# Patient Record
Sex: Female | Born: 1970
Health system: Southern US, Community
[De-identification: ages and names within clinical notes are randomized; demographics above are authoritative.]

## PROBLEM LIST (undated history)

## (undated) DIAGNOSIS — D649 Anemia, unspecified: Secondary | ICD-10-CM

## (undated) HISTORY — PX: WISDOM TOOTH EXTRACTION: SHX21

## (undated) HISTORY — PX: CHOLECYSTECTOMY: SHX55

## (undated) HISTORY — DX: Anemia, unspecified: D64.9

---

## 1998-06-28 ENCOUNTER — Other Ambulatory Visit: Admission: RE | Admit: 1998-06-28 | Discharge: 1998-06-28 | Payer: Self-pay | Admitting: Obstetrics & Gynecology

## 1998-06-28 ENCOUNTER — Encounter (INDEPENDENT_AMBULATORY_CARE_PROVIDER_SITE_OTHER): Payer: Self-pay | Admitting: Specialist

## 1999-03-20 ENCOUNTER — Inpatient Hospital Stay (HOSPITAL_COMMUNITY): Admission: AD | Admit: 1999-03-20 | Discharge: 1999-03-20 | Payer: Self-pay | Admitting: Obstetrics and Gynecology

## 2004-01-03 HISTORY — PX: REFRACTIVE SURGERY: SHX103

## 2004-03-25 ENCOUNTER — Ambulatory Visit: Payer: Self-pay | Admitting: Family Medicine

## 2004-04-06 ENCOUNTER — Ambulatory Visit: Payer: Self-pay | Admitting: Family Medicine

## 2005-05-16 ENCOUNTER — Ambulatory Visit: Payer: Self-pay | Admitting: Family Medicine

## 2005-07-10 ENCOUNTER — Encounter: Admission: RE | Admit: 2005-07-10 | Discharge: 2005-10-08 | Payer: Self-pay | Admitting: Family Medicine

## 2005-07-20 ENCOUNTER — Ambulatory Visit: Payer: Self-pay | Admitting: Family Medicine

## 2005-08-21 ENCOUNTER — Ambulatory Visit: Payer: Self-pay | Admitting: Family Medicine

## 2005-09-21 ENCOUNTER — Ambulatory Visit: Payer: Self-pay | Admitting: Family Medicine

## 2005-10-26 ENCOUNTER — Ambulatory Visit: Payer: Self-pay | Admitting: Family Medicine

## 2006-02-01 ENCOUNTER — Ambulatory Visit: Payer: Self-pay | Admitting: Family Medicine

## 2006-04-12 ENCOUNTER — Ambulatory Visit: Payer: Self-pay | Admitting: Family Medicine

## 2006-05-16 DIAGNOSIS — N390 Urinary tract infection, site not specified: Secondary | ICD-10-CM | POA: Insufficient documentation

## 2006-05-17 ENCOUNTER — Ambulatory Visit: Payer: Self-pay | Admitting: Family Medicine

## 2006-06-04 ENCOUNTER — Telehealth: Payer: Self-pay | Admitting: Family Medicine

## 2006-06-05 ENCOUNTER — Telehealth (INDEPENDENT_AMBULATORY_CARE_PROVIDER_SITE_OTHER): Payer: Self-pay | Admitting: *Deleted

## 2006-06-11 ENCOUNTER — Telehealth (INDEPENDENT_AMBULATORY_CARE_PROVIDER_SITE_OTHER): Payer: Self-pay | Admitting: *Deleted

## 2006-06-18 ENCOUNTER — Encounter: Admission: RE | Admit: 2006-06-18 | Discharge: 2006-06-18 | Payer: Self-pay | Admitting: Family Medicine

## 2006-08-14 ENCOUNTER — Ambulatory Visit: Payer: Self-pay | Admitting: Family Medicine

## 2006-08-21 ENCOUNTER — Encounter (INDEPENDENT_AMBULATORY_CARE_PROVIDER_SITE_OTHER): Payer: Self-pay | Admitting: *Deleted

## 2006-08-21 ENCOUNTER — Telehealth: Payer: Self-pay | Admitting: Family Medicine

## 2006-08-21 LAB — CONVERTED CEMR LAB
Creatinine,U: 65.3 mg/dL
GFR calc Af Amer: 144 mL/min
GFR calc non Af Amer: 119 mL/min
HDL: 31.2 mg/dL — ABNORMAL LOW (ref >39.0)
LDL Cholesterol: 84 mg/dL (ref 0–99)
Potassium: 4.1 meq/L (ref 3.5–5.1)
Sodium: 139 meq/L (ref 135–145)
Total CHOL/HDL Ratio: 4.1
Triglycerides: 71 mg/dL (ref 0–149)
VLDL: 14 mg/dL (ref 0–40)

## 2006-09-07 ENCOUNTER — Encounter: Payer: Self-pay | Admitting: Family Medicine

## 2006-11-12 ENCOUNTER — Ambulatory Visit: Payer: Self-pay | Admitting: Family Medicine

## 2006-11-12 DIAGNOSIS — R05 Cough: Secondary | ICD-10-CM

## 2006-11-12 DIAGNOSIS — J309 Allergic rhinitis, unspecified: Secondary | ICD-10-CM | POA: Insufficient documentation

## 2006-11-12 DIAGNOSIS — R059 Cough, unspecified: Secondary | ICD-10-CM | POA: Insufficient documentation

## 2006-12-12 ENCOUNTER — Telehealth (INDEPENDENT_AMBULATORY_CARE_PROVIDER_SITE_OTHER): Payer: Self-pay | Admitting: *Deleted

## 2007-01-08 ENCOUNTER — Telehealth (INDEPENDENT_AMBULATORY_CARE_PROVIDER_SITE_OTHER): Payer: Self-pay | Admitting: *Deleted

## 2007-01-22 ENCOUNTER — Encounter: Payer: Self-pay | Admitting: Family Medicine

## 2007-03-07 ENCOUNTER — Ambulatory Visit: Payer: Self-pay | Admitting: Family Medicine

## 2007-03-07 DIAGNOSIS — D649 Anemia, unspecified: Secondary | ICD-10-CM | POA: Insufficient documentation

## 2007-03-08 ENCOUNTER — Encounter: Payer: Self-pay | Admitting: Family Medicine

## 2007-03-11 ENCOUNTER — Telehealth (INDEPENDENT_AMBULATORY_CARE_PROVIDER_SITE_OTHER): Payer: Self-pay | Admitting: *Deleted

## 2007-05-30 ENCOUNTER — Encounter: Payer: Self-pay | Admitting: Family Medicine

## 2007-07-19 ENCOUNTER — Encounter (INDEPENDENT_AMBULATORY_CARE_PROVIDER_SITE_OTHER): Payer: Self-pay | Admitting: *Deleted

## 2007-07-19 ENCOUNTER — Ambulatory Visit: Payer: Self-pay | Admitting: Family Medicine

## 2007-07-19 ENCOUNTER — Encounter: Admission: RE | Admit: 2007-07-19 | Discharge: 2007-07-19 | Payer: Self-pay | Admitting: Family Medicine

## 2007-07-22 ENCOUNTER — Telehealth (INDEPENDENT_AMBULATORY_CARE_PROVIDER_SITE_OTHER): Payer: Self-pay | Admitting: *Deleted

## 2007-07-22 ENCOUNTER — Encounter: Payer: Self-pay | Admitting: Family Medicine

## 2007-11-25 ENCOUNTER — Encounter: Payer: Self-pay | Admitting: Family Medicine

## 2007-12-30 ENCOUNTER — Telehealth (INDEPENDENT_AMBULATORY_CARE_PROVIDER_SITE_OTHER): Payer: Self-pay | Admitting: *Deleted

## 2008-02-03 ENCOUNTER — Encounter: Payer: Self-pay | Admitting: Family Medicine

## 2008-02-03 HISTORY — PX: GASTRIC BYPASS: SHX52

## 2008-02-05 ENCOUNTER — Encounter: Payer: Self-pay | Admitting: Family Medicine

## 2008-02-10 ENCOUNTER — Telehealth (INDEPENDENT_AMBULATORY_CARE_PROVIDER_SITE_OTHER): Payer: Self-pay | Admitting: *Deleted

## 2008-02-28 ENCOUNTER — Encounter: Payer: Self-pay | Admitting: Family Medicine

## 2008-03-25 ENCOUNTER — Encounter: Payer: Self-pay | Admitting: Family Medicine

## 2008-04-08 ENCOUNTER — Encounter: Payer: Self-pay | Admitting: Family Medicine

## 2008-06-24 ENCOUNTER — Ambulatory Visit: Payer: Self-pay | Admitting: Obstetrics and Gynecology

## 2008-07-13 ENCOUNTER — Encounter: Payer: Self-pay | Admitting: Family Medicine

## 2008-08-07 ENCOUNTER — Encounter: Payer: Self-pay | Admitting: Family Medicine

## 2008-10-21 ENCOUNTER — Telehealth: Payer: Self-pay | Admitting: Family Medicine

## 2008-11-25 ENCOUNTER — Ambulatory Visit: Payer: Self-pay | Admitting: Family Medicine

## 2008-11-25 ENCOUNTER — Encounter (INDEPENDENT_AMBULATORY_CARE_PROVIDER_SITE_OTHER): Payer: Self-pay | Admitting: *Deleted

## 2008-11-25 DIAGNOSIS — Z9884 Bariatric surgery status: Secondary | ICD-10-CM | POA: Insufficient documentation

## 2008-12-01 ENCOUNTER — Telehealth: Payer: Self-pay | Admitting: Family Medicine

## 2008-12-28 ENCOUNTER — Encounter: Payer: Self-pay | Admitting: Family Medicine

## 2009-09-03 ENCOUNTER — Ambulatory Visit: Payer: Self-pay | Admitting: Family Medicine

## 2009-09-07 LAB — CONVERTED CEMR LAB
ALT: 24 units/L (ref 0–35)
AST: 20 units/L (ref 0–37)
Basophils Relative: 0.6 % (ref 0.0–3.0)
Bilirubin, Direct: 0.1 mg/dL (ref 0.0–0.3)
Chloride: 102 meq/L (ref 96–112)
Eosinophils Absolute: 0.1 10*3/uL (ref 0.0–0.7)
Eosinophils Relative: 1.8 % (ref 0.0–5.0)
Folate: 6.6 ng/mL
GFR calc non Af Amer: 119.15 mL/min (ref >60)
HCT: 38.2 % (ref 36.0–46.0)
LDL Cholesterol: 75 mg/dL (ref 0–99)
Lymphs Abs: 1.7 10*3/uL (ref 0.7–4.0)
MCHC: 33.9 g/dL (ref 30.0–36.0)
MCV: 85.5 fL (ref 78.0–100.0)
Monocytes Absolute: 0.4 10*3/uL (ref 0.1–1.0)
Neutrophils Relative %: 53 % (ref 43.0–77.0)
Platelets: 194 10*3/uL (ref 150.0–400.0)
Potassium: 4.2 meq/L (ref 3.5–5.1)
Saturation Ratios: 14.5 % — ABNORMAL LOW (ref 20.0–50.0)
Sodium: 137 meq/L (ref 135–145)
Total Bilirubin: 0.4 mg/dL (ref 0.3–1.2)
Total CHOL/HDL Ratio: 4
VLDL: 9 mg/dL (ref 0.0–40.0)
Vit D, 25-Hydroxy: 32 ng/mL (ref 30–89)
Vitamin B-12: 345 pg/mL (ref 211–911)
WBC: 4.7 10*3/uL (ref 4.5–10.5)

## 2009-09-08 ENCOUNTER — Encounter: Payer: Self-pay | Admitting: Family Medicine

## 2009-09-10 ENCOUNTER — Telehealth (INDEPENDENT_AMBULATORY_CARE_PROVIDER_SITE_OTHER): Payer: Self-pay | Admitting: *Deleted

## 2009-09-30 ENCOUNTER — Encounter: Payer: Self-pay | Admitting: Family Medicine

## 2009-12-19 ENCOUNTER — Emergency Department: Payer: Self-pay | Admitting: Unknown Physician Specialty

## 2010-01-30 LAB — CONVERTED CEMR LAB
ALT: 26 units/L (ref 0–35)
AST: 19 units/L (ref 0–37)
Albumin: 3.4 g/dL — ABNORMAL LOW (ref 3.5–5.2)
BUN: 6 mg/dL (ref 6–23)
Basophils Relative: 0.8 % (ref 0.0–3.0)
Chloride: 106 meq/L (ref 96–112)
Cholesterol: 138 mg/dL (ref 0–200)
Eosinophils Relative: 0.9 % (ref 0.0–5.0)
Ferritin: 11.1 ng/mL (ref 10.0–291.0)
Folate: 13.1 ng/mL
Glucose, Bld: 84 mg/dL (ref 70–99)
HCT: 42.2 % (ref 36.0–46.0)
Hemoglobin: 14.1 g/dL (ref 12.0–15.0)
Iron: 86 ug/dL (ref 42–145)
Ketones, urine, test strip: NEGATIVE
LDL Cholesterol: 96 mg/dL (ref 0–99)
Lymphs Abs: 1.7 10*3/uL (ref 0.7–4.0)
MCV: 90.8 fL (ref 78.0–100.0)
Monocytes Absolute: 0.3 10*3/uL (ref 0.1–1.0)
Neutro Abs: 3.5 10*3/uL (ref 1.4–7.7)
Nitrite: POSITIVE
Platelets: 180 10*3/uL (ref 150.0–400.0)
Potassium: 3.9 meq/L (ref 3.5–5.1)
Saturation Ratios: 24.6 % (ref 20.0–50.0)
Sodium: 140 meq/L (ref 135–145)
TSH: 1.65 microintl units/mL (ref 0.35–5.50)
Total Bilirubin: 0.7 mg/dL (ref 0.3–1.2)
Total Protein: 6 g/dL (ref 6.0–8.3)
Urobilinogen, UA: NEGATIVE
Vitamin B-12: 210 pg/mL — ABNORMAL LOW (ref 211–911)
WBC: 5.5 10*3/uL (ref 4.5–10.5)

## 2010-02-01 NOTE — Assessment & Plan Note (Signed)
Summary: needs lab for work/cbs   Vital Signs:  Patient profile:   40 year old female Height:      67 inches Weight:      212 pounds BMI:     33.32 Temp:     98.7 degrees F oral Pulse rate:   72 / minute BP sitting:   110 / 62  (left arm)  Vitals Entered By: Jeremy Johann CMA (September 03, 2009 12:44 PM) CC: fasting, labs, complete form   History of Present Illness: Pt here to have labs and form filled out for work.  No complaints.   Current Medications (verified): 1)  None  Allergies (verified): 1)  ! Pcn 2)  ! * Tetanus 3)  ! Amoxicillin  Past History:  Past medical, surgical, family and social histories (including risk factors) reviewed for relevance to current acute and chronic problems.  Past Medical History: Reviewed history from 05/16/2006 and no changes required. Diabetes mellitus, type II  Past Surgical History: Reviewed history from 11/25/2008 and no changes required. gastric bypass  02/03/2008 wisdom teeth lasik surgery-- 2006  Family History: Reviewed history from 11/25/2008 and no changes required. M--A fib, chf, dvt Family History Diabetes 1st degree relative Family History High cholesterol Family History Hypertension MGF--stroke MGM--MI at 42 yo F--mesothilioma  Social History: Reviewed history from 11/25/2008 and no changes required. 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 Lungs are clear to auscultation, no crackles or wheezes. Heart:  Normal rate and regular rhythm. S1 and S2 normal without gallop, murmur, click, rub or other extra sounds. Abdomen:  Bowel sounds positive,abdomen soft and non-tender without masses, organomegaly or  hernias noted. Psych:  Cognition and judgment appear intact. Alert and cooperative with normal attention span and concentration. No apparent delusions, illusions, hallucinations   Impression & Recommendations:  Problem # 1:  BARIATRIC SURGERY STATUS (ICD-V45.86)  Orders: TLB-IBC Pnl (Iron/FE;Transferrin) (83550-IBC) T-Vitamin D (25-Hydroxy) (91478-29562) Vit B12 1000 mcg (J3420) Admin of Therapeutic Inj  intramuscular or subcutaneous (13086)  Problem # 2:  UNSPECIFIED ANEMIA (ICD-285.9)  The following medications were removed from the medication list:    Nascobal 500 Mcg/0.85ml Soln (Cyanocobalamin) .Marland Kitchen... 1 spray 1 nostril once a week Her updated medication list for this problem includes:    Cyanocobalamin 1000 Mcg/ml Soln (Cyanocobalamin) .Marland Kitchen... 1 ml subcutaneously weekly x4 then 1ml subcutaneously monthly  Orders: Venipuncture (57846) TLB-Lipid Panel (80061-LIPID) TLB-BMP (Basic Metabolic Panel-BMET) (80048-METABOL) TLB-Hepatic/Liver Function Pnl (80076-HEPATIC) TLB-CBC Platelet - w/Differential (85025-CBCD) TLB-B12 + Folate Pnl (96295_28413-K44/WNU) TLB-IBC Pnl (Iron/FE;Transferrin) (83550-IBC) T-Vitamin D (25-Hydroxy) [id number])  Hgb: 14.1 (11/25/2008)   Hct: 42.2 (11/25/2008)   Platelets: 180.0 (11/25/2008) RBC: 4.64 (11/25/2008)   RDW: 13.4 (11/25/2008)   WBC: 5.5 (11/25/2008) MCV: 90.8 (11/25/2008)   MCHC: 33.3 (11/25/2008) Ferritin: 11.1 (11/25/2008) Iron: 86 (11/25/2008)   % Sat: 24.6 (11/25/2008) B12: 210 (11/25/2008)   Folate: 13.1 (11/25/2008)   TSH: 1.65 (11/25/2008)  Complete Medication List: 1)  Cyanocobalamin 1000 Mcg/ml Soln (Cyanocobalamin) .Marland Kitchen.. 1 ml subcutaneously weekly x4 then 1ml subcutaneously monthly Prescriptions: CYANOCOBALAMIN 1000 MCG/ML SOLN (CYANOCOBALAMIN) 1 ml Subcutaneously weekly x4 then 1ml Subcutaneously monthly  #3 months x 3   Entered and Authorized by:   Loreen Freud DO   Signed by:   Loreen Freud  DO on 09/03/2009   Method used:    Faxed to ...       [facility])             , [place]         Ph: [phone]       Fax: [phone]   RxID:   [id number] CYANOCOBALAMIN 1000 MCG/ML SOLN (CYANOCOBALAMIN) 1 ml Subcutaneously weekly x4 then 1ml Subcutaneously monthly  #3 months x 3   Entered and Authorized by:   Loreen Freud DO   Signed by:   Loreen Freud DO on 09/03/2009   Method used:   Electronically to        [facility] [phone]* (retail)       [address]       [place], [place]  [address]       Ph: [phone]       Fax: [phone]   RxID:   [id number]    Medication Administration  Injection # 1:    Medication: Vit B12 1000 mcg    Diagnosis: BARIATRIC SURGERY STATUS (ICD-V45.86)    Route: IM    Site: L deltoid    Exp Date: 03/03/2011    Lot #: [id number]    Mfr: American Regent    Patient tolerated injection without complications    Given by: Almeta Monas CMA Duncan Dull) (September 03, 2009 1:14 PM)  Orders Added: 1)  Venipuncture [60109] 2)  TLB-Lipid Panel [80061-LIPID] 3)  TLB-BMP (Basic Metabolic Panel-BMET) [80048-METABOL] 4)  TLB-Hepatic/Liver Function Pnl [80076-HEPATIC] 5)  TLB-CBC Platelet - w/Differential [85025-CBCD] 6)  TLB-B12 + Folate Pnl [82746_82607-B12/FOL] 7)  TLB-IBC Pnl (Iron/FE;Transferrin) [83550-IBC] 8)  T-Vitamin D (25-Hydroxy) [32355-73220] 9)  Est. Patient Level III [25427] 10)  Vit B12 1000 mcg [J3420] 11)  Admin of Therapeutic Inj  intramuscular or subcutaneous [06237]

## 2010-02-01 NOTE — Progress Notes (Signed)
Summary: Rx Clarification  Phone Note Call from Patient   Caller: Patient Call For: Loreen Freud DO Details for Reason: [employer] need Med verification  on Rx Summary of Call: Spk with pt and she advised that Erin Howe will not fill Rx until they have clarification. Called [employer] and verified meds as B-12 generic 1ml weekly x4 wks then once a month for 3 months.  Bosie Clos (pharmacist) voiced understanding, call ended.  Almeta Monas CMA Duncan Dull)  September 10, 2009 8:50 AM  Initial call taken by: Almeta Monas CMA Duncan Dull),  September 10, 2009 8:50 AM     Appended Document: Rx Clarification spk with Eber Jones at [facility], whocalled to advise that the Vit B12 was not avai in injection, said they have 2500 micrograms tablet available at a quantity of 100. Dr. Laury Axon gave verbal authorization. Vitamin B-12 2500 micrograms 1 sublingual daily # 100 with 3 refills. OK'd by Violet the Pharmacist. Pt aware.

## 2010-02-01 NOTE — Letter (Signed)
Summary: Metabolic Syndrome Form/[employer]  Metabolic Syndrome Form/[employer]   Imported By: Lanelle Bal 09/20/2009 10:35:19  _____________________________________________________________________  External Attachment:    Type:   Image     Comment:   External Document

## 2010-02-01 NOTE — Medication Information (Signed)
Summary: Care Consideration Regarding Lipid Modifying Therapy/[employer]  Care Consideration Regarding Lipid Modifying Therapy/[employer]   Imported By: Lanelle Bal 10/26/2009 14:00:33  _____________________________________________________________________  External Attachment:    Type:   Image     Comment:   External Document

## 2010-02-01 NOTE — Letter (Signed)
Summary: Letter Regarding Disease Mgmt. Program/[employer]  Letter Regarding Disease Mgmt. Program/[employer]   Imported By: Lanelle Bal 01/19/2009 14:24:27  _____________________________________________________________________  External Attachment:    Type:   Image     Comment:   External Document

## 2010-05-20 NOTE — Letter (Signed)
September 06, 2005     [facility]  [address], [address]  [place], [place] [address]   RE:  Erin Howe, Erin Howe  MRN:  [id number]  /  DOB:  1970/01/14   To Whom It May Concern:   Ms. Raimondi is a 40 year old white female who is interested in having gastric  bypass surgery.  She has only been seeing me since May of this year who has  a long medical history of severe obesity, chronic bladder infection, PCOS  and diabetes.  Patient has already been to [employer], has done extensive research on her own and understands the risks of  the surgery as well as the diet expectations immediately before and after  surgery.  The patient, in the past, has tried various attempts at dieting.  She has one calorie counting three times and lost 10 pounds but gained 15  back, that was back in 1991.  In 1994, she did a low carbohydrate diet and  lost 10 pounds but gained 30.  In 1993, she tried a low fat diet two times  and lost 15 pounds but gained 25 back.  In 1986, she tried a [employer] and lost 30 pounds but gained 50 back.  She has done [employer] and  lost 2 pounds in 1990.  She has tried [employer] and [employer]  as well as [employer] and [employer]ast and has been on Meridia which was  the last attempt in 2004, she lost 5 pounds and gained 20.  Patient has also  been to a dietician two times, lost 10 pounds and gained 20 back.  She is  currently at her highest weight which is 373 pounds, that was on August 21, 2005, her height is 5 feet 6 inches, her BMI is 60.1.  In 2003, the patient  weighed 346 pounds, in 2004 she was at 347.5, in 2005 all we could get from  old records is actually greater than 350 pounds and in 2007 the patient  states she was at 371 pounds though scales in her doctor's office only went  to 350.  At her first visit here in May, the patient was at 371 pounds.  As  previously stated, the patient has  done research on the gastric bypass  surgery and is aware that she will need to lose some weight before the  surgery and has already started coming into my office on a monthly basis for  weight checks and diet counseling.  They are considering  also the [employer] program but I do consider this patient to be a good  candidate for the surgery after the appropriate amount of weight is lost.    Sincerely,      Lelon Perla, DO   YRL/MedQ  DD:  09/06/2005  DT:  09/06/2005  Job #:  [id number]

## 2010-07-21 ENCOUNTER — Encounter: Payer: Self-pay | Admitting: Family Medicine

## 2010-07-22 ENCOUNTER — Encounter: Payer: Self-pay | Admitting: Family Medicine

## 2010-07-22 ENCOUNTER — Ambulatory Visit (INDEPENDENT_AMBULATORY_CARE_PROVIDER_SITE_OTHER): Payer: Managed Care, Other (non HMO) | Admitting: Family Medicine

## 2010-07-22 VITALS — BP 120/82 | HR 56 | Temp 98.7°F | Resp 16 | Ht 65.0 in | Wt 237.0 lb

## 2010-07-22 DIAGNOSIS — Z9884 Bariatric surgery status: Secondary | ICD-10-CM

## 2010-07-22 DIAGNOSIS — Z0000 Encounter for general adult medical examination without abnormal findings: Secondary | ICD-10-CM

## 2010-07-22 DIAGNOSIS — E538 Deficiency of other specified B group vitamins: Secondary | ICD-10-CM

## 2010-07-22 DIAGNOSIS — F988 Other specified behavioral and emotional disorders with onset usually occurring in childhood and adolescence: Secondary | ICD-10-CM

## 2010-07-22 LAB — VITAMIN B12: Vitamin B-12: 282 pg/mL (ref 211–911)

## 2010-07-22 LAB — BASIC METABOLIC PANEL
Chloride: 101 mEq/L (ref 96–112)
GFR: 154.24 mL/min (ref >60.00)
Potassium: 3.6 mEq/L (ref 3.5–5.1)
Sodium: 138 mEq/L (ref 135–145)

## 2010-07-22 LAB — POCT URINALYSIS DIPSTICK
Bilirubin, UA: NEGATIVE
Glucose, UA: NEGATIVE
Spec Grav, UA: 1.03
Urobilinogen, UA: 0.2

## 2010-07-22 LAB — TSH: TSH: 1.68 u[IU]/mL (ref 0.35–5.50)

## 2010-07-22 LAB — HEPATIC FUNCTION PANEL
ALT: 19 U/L (ref 0–35)
AST: 20 U/L (ref 0–37)
Albumin: 3.7 g/dL (ref 3.5–5.2)
Total Bilirubin: 0.4 mg/dL (ref 0.3–1.2)
Total Protein: 6.7 g/dL (ref 6.0–8.3)

## 2010-07-22 LAB — CBC WITH DIFFERENTIAL/PLATELET
Basophils Relative: 0.5 % (ref 0.0–3.0)
Eosinophils Relative: 2.5 % (ref 0.0–5.0)
HCT: 31.5 % — ABNORMAL LOW (ref 36.0–46.0)
Hemoglobin: 10.1 g/dL — ABNORMAL LOW (ref 12.0–15.0)
Lymphs Abs: 1.9 10*3/uL (ref 0.7–4.0)
MCV: 70.2 fl — ABNORMAL LOW (ref 78.0–100.0)
Monocytes Absolute: 0.5 10*3/uL (ref 0.1–1.0)
Monocytes Relative: 8.6 % (ref 3.0–12.0)
Neutro Abs: 3.6 10*3/uL (ref 1.4–7.7)
RBC: 4.49 Mil/uL (ref 3.87–5.11)
WBC: 6.2 10*3/uL (ref 4.5–10.5)

## 2010-07-22 LAB — LIPID PANEL
Cholesterol: 119 mg/dL (ref 0–200)
LDL Cholesterol: 71 mg/dL (ref 0–99)

## 2010-07-22 LAB — HEMOGLOBIN A1C: Hgb A1c MFr Bld: 6.1 % (ref 4.6–6.5)

## 2010-07-22 MED ORDER — AMPHETAMINE-DEXTROAMPHET ER 20 MG PO CP24: 20.0000 mg | ORAL_CAPSULE | ORAL | Status: DC

## 2010-07-22 MED ORDER — OMEPRAZOLE 40 MG PO CPDR: 40.0000 mg | DELAYED_RELEASE_CAPSULE | Freq: Every day | ORAL | Status: DC

## 2010-07-22 MED ORDER — CYANOCOBALAMIN 1000 MCG/ML IJ SOLN: 1000.0000 ug | Freq: Once | INTRAMUSCULAR | Status: DC

## 2010-07-22 NOTE — Patient Instructions (Signed)

## 2010-07-22 NOTE — Progress Notes (Signed)
  Subjective:     Erin Howe is a 40 y.o. female and is here for a comprehensive physical exam. The patient reports problems - trouble with focusing,  dx with add years ago--never on meds..  History   Social History  . Marital Status: Married    Spouse Name: N/A    Number of Children: N/A  . Years of Education: N/A   Occupational History  . 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 Smoking status: Never Smoker   . Smokeless tobacco: Not on file  . Alcohol Use: No  . Drug Use: No  . Sexually Active: Yes -- Female partner(s)   Other Topics Concern  . Not on file   Social History Narrative  . No narrative on file   Health Maintenance  Topic Date Due  . Pap Smear  07/21/2012  . Tetanus/tdap  07/21/2020    The following portions of the patient's history were reviewed and updated as appropriate: allergies, current medications, past family history, past medical history, past social history, past surgical history and problem list.  Review of Systems Review of Systems  Constitutional: Negative for activity change, appetite change and fatigue.  HENT: Negative for hearing loss, congestion, tinnitus and ear discharge.  dentist q55m Eyes: Negative for visual disturbance (see optho q1y -- vision corrected to 20/20 with glasses).  Respiratory: Negative for cough, chest tightness and shortness of breath.   Cardiovascular: Negative for chest pain, palpitations and leg swelling.  Gastrointestinal: Negative for abdominal pain, diarrhea, constipation and abdominal distention.  Genitourinary: Negative for urgency, frequency, decreased urine volume and difficulty urinating.  Musculoskeletal: Negative for back pain, arthralgias and gait problem.  Skin: Negative for color change, pallor and rash.  Neurological: Negative for dizziness, [place]zee-headedness, numbness and headaches.  Hematological: Negative for adenopathy. Does not bruise/bleed easily.  Psychiatric/Behavioral:  Negative for suicidal ideas, confusion, sleep disturbance, self-injury, dysphoric mood, decreased concentration and agitation.       Objective:    BP 120/82  Pulse 56  Temp(Src) 98.7 F (37.1 C) (Oral)  Resp 16  Ht 5\' 5"  (1.651 m)  Wt 237 lb (107.502 kg)  BMI 39.44 kg/m2  SpO2 98% General appearance: alert, cooperative, appears stated age, no distress and morbidly obese Head: Normocephalic, without obvious abnormality, atraumatic Eyes: conjunctivae/corneas clear. PERRL, EOM's intact. Fundi benign. Ears: normal TM's and external ear canals both ears Nose: Nares normal. Septum midline. Mucosa normal. No drainage or sinus tenderness. Throat: lips, mucosa, and tongue normal; teeth and gums normal Neck: no adenopathy, no carotid bruit, no JVD, supple, symmetrical, trachea midline and thyroid not enlarged, symmetric, no tenderness/mass/nodules Back: symmetric, no curvature. ROM normal. No CVA tenderness. Lungs: clear to auscultation bilaterally Heart: regular rate and rhythm, S1, S2 normal, no murmur, click, rub or gallop Abdomen: soft, non-tender; bowel sounds normal; no masses,  no organomegaly Extremities: extremities normal, atraumatic, no cyanosis or edema Pulses: 2+ and symmetric Skin: Skin color, texture, turgor normal. No rashes or lesions Lymph nodes: Cervical, supraclavicular, and axillary nodes normal. Neurologic: Alert and oriented X 3, normal strength and tone. Normal symmetric reflexes. Normal coordination and gait psych-- no depression, no anxiety    Assessment:    Healthy female exam.   Obesity   Plan:    [facility] utd Discussed diet and exercise See After Visit Summary for Counseling Recommendations

## 2010-07-24 LAB — VITAMIN D 1,25 DIHYDROXY: Vitamin D3 1, 25 (OH)2: 71 pg/mL

## 2010-07-26 ENCOUNTER — Encounter: Payer: Self-pay | Admitting: *Deleted

## 2010-07-27 ENCOUNTER — Telehealth: Payer: Self-pay | Admitting: *Deleted

## 2010-07-27 MED ORDER — FERROUS SULFATE 325 (65 FE) MG PO TBEC: 325.0000 mg | DELAYED_RELEASE_TABLET | Freq: Every day | ORAL | Status: DC

## 2010-07-27 NOTE — Telephone Encounter (Signed)
Pt states that she was unable to find 325 mg of iron OTC. Pt indicated that per pharmacy the only strength available is     65 mg unless she has a Rx. Pt also notes that pharmacist advise her that 325 mg is a lot of iron to be taking.Please advise

## 2010-07-27 NOTE — Telephone Encounter (Signed)
325 mg ferrous sulfate is not too much to be taken daily----but if her pharmacy does not carry it she can take whatever is otc at her pharmacy and we can recheck in 1 month

## 2010-07-27 NOTE — Telephone Encounter (Signed)
Pt aware, would like Rx so that insurance can cover med. Pt aware Rx printed, Pt request that Rx be given to husband on tomorrow @ his OV.

## 2010-07-27 NOTE — Telephone Encounter (Signed)
Left message to call office

## 2010-08-22 ENCOUNTER — Ambulatory Visit (INDEPENDENT_AMBULATORY_CARE_PROVIDER_SITE_OTHER): Payer: Managed Care, Other (non HMO) | Admitting: Family Medicine

## 2010-08-22 ENCOUNTER — Encounter: Payer: Self-pay | Admitting: Family Medicine

## 2010-08-22 VITALS — BP 140/78 | HR 80 | Temp 99.4°F | Wt 231.0 lb

## 2010-08-22 DIAGNOSIS — Z9884 Bariatric surgery status: Secondary | ICD-10-CM

## 2010-08-22 DIAGNOSIS — R52 Pain, unspecified: Secondary | ICD-10-CM

## 2010-08-22 DIAGNOSIS — F988 Other specified behavioral and emotional disorders with onset usually occurring in childhood and adolescence: Secondary | ICD-10-CM

## 2010-08-22 DIAGNOSIS — D649 Anemia, unspecified: Secondary | ICD-10-CM

## 2010-08-22 MED ORDER — ACETAMINOPHEN 325 MG PO TABS: 650.0000 mg | ORAL_TABLET | ORAL | Status: DC | PRN

## 2010-08-22 MED ORDER — ACETAMINOPHEN 325 MG PO TABS: 650.0000 mg | ORAL_TABLET | ORAL | Status: AC | PRN

## 2010-08-22 MED ORDER — AMPHETAMINE-DEXTROAMPHET ER 30 MG PO CP24: 30.0000 mg | ORAL_CAPSULE | ORAL | Status: DC

## 2010-08-22 NOTE — Progress Notes (Signed)
  Subjective:    Patient ID: Erin Howe, female    DOB: 19-Jul-1970, 40 y.o.   MRN: [id number]  HPI Pt here for add f/u and to recheck b12 and cbc.  Pt states adderall is not lasting long enough.   Review of Systems As above    Objective:   Physical Exam  Constitutional: She is oriented to person, place, and time. She appears well-developed and well-nourished.  Cardiovascular: Normal rate, regular rhythm and normal heart sounds.   Pulmonary/Chest: Effort normal and breath sounds normal.  Abdominal: Soft. Bowel sounds are normal.  Neurological: She is alert and oriented to person, place, and time.  Psychiatric: She has a normal mood and affect. Her behavior is normal. Judgment and thought content normal.          Assessment & Plan:

## 2010-08-22 NOTE — Patient Instructions (Signed)
Attention Deficit-Hyperactivity Disorder ADHD Attention deficit-hyperactivity disorder (ADHD) is a problem with behavior issues based on the way the brain functions (neurobehavioral disorder). It is a common reason for behavior and academic problems in school. CAUSES The cause of ADHD is unknown in most cases. It may run in families. It sometimes can be associated with learning disabilities and other behavioral problems. SYMPTOMS There are three types of ADHD. Some of the symptoms include:  Inattentive   Gets bored or distracted easily   Loses or forgets things. Forgets to hand in homework.   Has trouble organizing or completing tasks.   Difficulty staying on task.   An inability to organize daily tasks and school work.   Leaving projects, chores and homework unfinished.   Trouble paying attention or responding to details. Careless mistakes.   Difficulty following directions. Often seems like is not listening.   Dislikes activities that require sustained attention (like chores or homework).   Hyperactive-impulsive   Feels like it is impossible to sit still or stay in a seat. Fidgeting with hands and feet.   Trouble waiting turn.   Talking too much or out of turn. Interruptive.   Speaks or acts impulsively   Aggressive, disruptive behavior   Constantly busy or on the go, noisy.   Combined   Has symptoms of both of the above.  Often children with ADHD feel discouraged about themselves and with school. They often perform well below their abilities in school. These symptoms can cause problems in home, school, and in relationships with peers. As children get older, the excess motor activities can calm down, but the problems with paying attention and staying organized persist. Most children do not outgrow ADHD but with good treatment can learn to cope with the symptoms. DIAGNOSIS When ADHD is suspected, the diagnosis should be made by professionals trained in ADHD.    Diagnosis will include:  Ruling out other reasons for the child's behavior.   The caregivers will check with the child's school and check their medical records.   They will talk to teachers and parents.   Behavior rating scales for the child will be filled out by those dealing with the child on a daily basis.  A diagnosis is made only after all information has been considered. TREATMENT Treatment usually includes behavioral treatment often along with medicines. It may include stimulant medicines. The stimulant medicines decrease impulsivity and hyperactivity and increase attention. Other medicines used include antidepressants and certain blood pressure medicines. Most experts agree that treatment for ADHD should address all aspects of the child's functioning. Treatment should not be limited to the use of medicines alone. Treatment should include structured classroom management. The parents must receive education to address rewarding good behavior, discipline and limit-setting. Tutoring and/or behavioral therapy should be available for the child. If untreated, the disorder can have long term serious effects into adolescence and adulthood. HOMECARE INSTRUCTIONS   Often with ADHD there is a lot of frustration among the family in dealing with the illness. There is often blame and anger that is not warranted. This is a life long illness. There is no way to prevent ADHD. In many cases, because the problem affects the family as a whole, the entire family may need help. A therapist can help the family find better ways to handle the disruptive behaviors and promote change. If the child is young, most of the therapist's work is with the parents. Parents will learn techniques for coping with and improving their child's behavior.   Sometimes only the child with the ADHD needs counseling. Your caregivers can help you make these decisions.   Children with ADHD may need help in organizing. Here are some helpful  tips:   Keep routines the same every day from wake-up time to bedtime. Schedule everything. This includes homework and playtime. This should include outdoor and indoor recreation. Keep the schedule on the refrigerator or a bulletin board where it is frequently seen. Mark schedule changes as far in advance as possible.   Have a place for everything and keep everything in its place. This includes clothing, backpacks, and school supplies.   Encourage writing down assignments and bringing home needed books.   Offer your child a well-balanced diet. Breakfast is especially important for school performance. Children should avoid drinks with caffeine including:   Soft drinks.   Coffee.   Tea.   However, some older children (adolescents) may find these drinks helpful in improving their attention.   Children with ADHD need consistent rules that they can understand and follow. If rules are followed, give small rewards. Children with ADHD often receive, and expect, criticism. Look for good behavior and praise it. Set realistic goals. Give clear instructions. Look for activities that can foster success and self-esteem. Make time for pleasant activities with your child. Give lots of affection.   Parents are their children's greatest advocates. Learn as much as possible about ADHD. This helps you become a stronger and better advocate for your child. It also helps you educate your child's teachers and instructors if they feel inadequate in these areas. Parent support groups are often helpful. A national group with local chapters is called CHADD (Children and Adults with Attention Deficit/Hyperactivity Disorder).  PROGNOSIS  There is no cure for ADHD. Children with the disorder seldom outgrow it. Many find adaptive ways to accommodate the ADHD as they mature. SEEK MEDICAL CARE IF YOUR CHILD HAS:  Repeated muscle twitches, cough or speech outbursts.   Sleep problems.   Marked loss of appetite.    Depression.   New or worsening behavioral problems.   Dizziness.   Racing heart.   Stomach pains.   Headaches.  Document Released: 12/09/2001 Document Re-Released: 09/28/2007 [employer] Patient Information 2011 [employer]. 

## 2010-08-23 DIAGNOSIS — F988 Other specified behavioral and emotional disorders with onset usually occurring in childhood and adolescence: Secondary | ICD-10-CM | POA: Insufficient documentation

## 2010-08-23 LAB — CBC WITH DIFFERENTIAL/PLATELET
Basophils Absolute: 0 10*3/uL (ref 0.0–0.1)
Eosinophils Absolute: 0.1 10*3/uL (ref 0.0–0.7)
HCT: 34 % — ABNORMAL LOW (ref 36.0–46.0)
Hemoglobin: 10.8 g/dL — ABNORMAL LOW (ref 12.0–15.0)
Lymphocytes Relative: 44.6 % (ref 12.0–46.0)
Lymphs Abs: 2 10*3/uL (ref 0.7–4.0)
MCHC: 31.7 g/dL (ref 30.0–36.0)
Neutro Abs: 1.7 10*3/uL (ref 1.4–7.7)
RDW: 19.3 % — ABNORMAL HIGH (ref 11.5–14.6)

## 2010-08-23 NOTE — Assessment & Plan Note (Signed)
Increase adderall xr to 30 mg daily rto 6 month or sooner prn

## 2010-08-23 NOTE — Assessment & Plan Note (Signed)
Check b12 and cbc

## 2010-09-29 ENCOUNTER — Other Ambulatory Visit: Payer: Self-pay | Admitting: Family Medicine

## 2010-09-29 DIAGNOSIS — F988 Other specified behavioral and emotional disorders with onset usually occurring in childhood and adolescence: Secondary | ICD-10-CM

## 2010-09-30 MED ORDER — AMPHETAMINE-DEXTROAMPHET ER 20 MG PO CP24: 20.0000 mg | ORAL_CAPSULE | ORAL | Status: DC

## 2010-09-30 NOTE — Telephone Encounter (Signed)
Please advise      KP 

## 2010-09-30 NOTE — Telephone Encounter (Signed)
As long as she is not due for ov its ok

## 2010-09-30 NOTE — Telephone Encounter (Signed)
Patient aware Rx ready for pick up.      KP 

## 2010-10-07 ENCOUNTER — Other Ambulatory Visit: Payer: Self-pay

## 2010-10-07 DIAGNOSIS — F988 Other specified behavioral and emotional disorders with onset usually occurring in childhood and adolescence: Secondary | ICD-10-CM

## 2010-10-07 MED ORDER — AMPHETAMINE-DEXTROAMPHET ER 30 MG PO CP24: 30.0000 mg | ORAL_CAPSULE | ORAL | Status: DC

## 2010-10-07 NOTE — Telephone Encounter (Signed)
Reprinted Rx patient had been increased to 30 mg   KP

## 2011-01-16 ENCOUNTER — Telehealth: Payer: Self-pay | Admitting: Family Medicine

## 2011-01-16 DIAGNOSIS — F988 Other specified behavioral and emotional disorders with onset usually occurring in childhood and adolescence: Secondary | ICD-10-CM

## 2011-01-16 MED ORDER — AMPHETAMINE-DEXTROAMPHET ER 30 MG PO CP24: 30.0000 mg | ORAL_CAPSULE | ORAL | Status: DC

## 2011-01-16 NOTE — Telephone Encounter (Signed)
VM left advising Rx ready for pick up.     KP 

## 2011-01-16 NOTE — Telephone Encounter (Signed)
Patient would like a 90 day refill for adderall

## 2011-02-07 ENCOUNTER — Telehealth: Payer: Self-pay

## 2011-02-07 DIAGNOSIS — F988 Other specified behavioral and emotional disorders with onset usually occurring in childhood and adolescence: Secondary | ICD-10-CM

## 2011-02-07 MED ORDER — AMPHETAMINE-DEXTROAMPHET ER 30 MG PO CP24: 30.0000 mg | ORAL_CAPSULE | ORAL | Status: DC

## 2011-02-07 NOTE — Telephone Encounter (Signed)
Ok to give 1 month supply

## 2011-02-07 NOTE — Telephone Encounter (Signed)
Discussed with patient and she voiced understanding.... Rx for 30 days printed and left at check in    [facility]

## 2011-02-07 NOTE — Telephone Encounter (Signed)
msg from patient and she stated that she mailed in her Adderall to the mail order company and the did not receive it, she wants to get a refill. Please advise     KP

## 2011-02-13 ENCOUNTER — Telehealth: Payer: Self-pay | Admitting: Family Medicine

## 2011-02-13 NOTE — Telephone Encounter (Signed)
Offered an apt and patient declined     KP

## 2011-02-13 NOTE — Telephone Encounter (Signed)
They could have been seen in sat clinic----what did pt do.?

## 2011-02-13 NOTE — Telephone Encounter (Signed)
[facility] Triage Call Report Triage Record Num: [id number] Operator: Griselda Miner Patient Name: Erin Howe Call Date & Time: 02/11/2011 3:36:25PM Patient Phone: [phone] PCP: Lelon Perla Patient Gender: Female PCP Fax : [phone] Patient DOB: [birth year] Practice Name: [facility] Reason for Call: Caller: Frankye/Patient; PCP: Lelon Perla.; CB#: [phone]; Call regarding Rash/Hives; Mild rash for last 2-3 dayss. Children were dx with Scabes today with treatmend ordered by pediatrician. Children's doctor reccommended to call primary care doctor for treatment. Red bumps on back and underneath breast size (only 5 bumps total) of an pin-head and slightly raised. LMP 12/12. Afebrile. Triaged using Rash with disposiion to see provider within 2 wks. Per nursing judgement with childrens' dx called Dr. Milinda Antis (on call) who advised them to be seen. Stated they could come to the office on Monday 02/13/11 and could get something OTC from drug store to treat in the interim. Another option would be Urgent Care. Patient was not happy and stated in otherwise I wasted my time by calling you. Unable to give complete care advise. Was not able to review meds or clinical profile Protocol(s) Used: Rash Recommended Outcome per Protocol: See Provider within 2 Weeks Reason for Outcome: All other situations Physician Instructions / Orders: Dr. Milinda Antis consulted who advised them to be seen. Stated they could come to the office on Monday 02/13/11 and could get something OTC from drug store to treat in the interim. Another option would be Urgent Care. Care Advice:

## 2011-04-19 ENCOUNTER — Other Ambulatory Visit: Payer: Self-pay

## 2011-04-19 LAB — LIPID PANEL
Cholesterol: 100 mg/dL (ref 0–200)
HDL Cholesterol: 32 mg/dL — ABNORMAL LOW (ref 40–60)
VLDL Cholesterol, Calc: 10 mg/dL (ref 5–40)

## 2011-04-19 LAB — COMPREHENSIVE METABOLIC PANEL
Albumin: 3.5 g/dL (ref 3.4–5.0)
Alkaline Phosphatase: 84 U/L (ref 50–136)
Calcium, Total: 8.4 mg/dL — ABNORMAL LOW (ref 8.5–10.1)
Chloride: 103 mmol/L (ref 98–107)
Co2: 28 mmol/L (ref 21–32)
Creatinine: 0.59 mg/dL — ABNORMAL LOW (ref 0.60–1.30)
EGFR (African American): >60
Glucose: 93 mg/dL (ref 65–99)
Osmolality: 276 (ref 275–301)
Potassium: 4.1 mmol/L (ref 3.5–5.1)
SGPT (ALT): 39 U/L
Sodium: 139 mmol/L (ref 136–145)

## 2011-04-19 LAB — IRON AND TIBC
Iron Bind.Cap.(Total): 436 ug/dL (ref 250–450)
Iron Saturation: 5 %
Iron: 22 ug/dL — ABNORMAL LOW (ref 50–170)

## 2011-04-19 LAB — CBC WITH DIFFERENTIAL/PLATELET
Basophil #: 0.1 10*3/uL (ref 0.0–0.1)
Basophil %: 1 %
Eosinophil %: 2 %
Lymphocyte #: 1.4 10*3/uL (ref 1.0–3.6)
MCH: 19.9 pg — ABNORMAL LOW (ref 26.0–34.0)
MCHC: 29.8 g/dL — ABNORMAL LOW (ref 32.0–36.0)
MCV: 67 fL — ABNORMAL LOW (ref 80–100)
Monocyte #: 0.4 x10 3/mm (ref 0.2–0.9)
Monocyte %: 6.7 %
Neutrophil %: 64.9 %
RBC: 4.66 10*6/uL (ref 3.80–5.20)
RDW: 17.5 % — ABNORMAL HIGH (ref 11.5–14.5)
WBC: 5.7 10*3/uL (ref 3.6–11.0)

## 2011-04-19 LAB — MAGNESIUM: Magnesium: 1.9 mg/dL

## 2011-04-19 LAB — HEMOGLOBIN A1C: Hemoglobin A1C: 5.9 % (ref 4.2–6.3)

## 2011-06-02 ENCOUNTER — Other Ambulatory Visit: Payer: Self-pay | Admitting: *Deleted

## 2011-06-02 DIAGNOSIS — F988 Other specified behavioral and emotional disorders with onset usually occurring in childhood and adolescence: Secondary | ICD-10-CM

## 2011-06-02 MED ORDER — AMPHETAMINE-DEXTROAMPHET ER 30 MG PO CP24: 30.0000 mg | ORAL_CAPSULE | ORAL | Status: DC

## 2011-06-02 NOTE — Telephone Encounter (Signed)
Pt aware Rx ready for pickup after 9 am on monday and 6 month follow-up due. Pt states that she will call back later to schedule OV.

## 2011-07-13 ENCOUNTER — Other Ambulatory Visit: Payer: Self-pay | Admitting: Family Medicine

## 2011-07-13 DIAGNOSIS — E538 Deficiency of other specified B group vitamins: Secondary | ICD-10-CM

## 2011-07-13 DIAGNOSIS — F988 Other specified behavioral and emotional disorders with onset usually occurring in childhood and adolescence: Secondary | ICD-10-CM

## 2011-07-13 MED ORDER — AMPHETAMINE-DEXTROAMPHET ER 30 MG PO CP24: 30.0000 mg | ORAL_CAPSULE | ORAL | Status: DC

## 2011-07-13 MED ORDER — CYANOCOBALAMIN 1000 MCG/ML IJ SOLN: 1000.0000 ug | Freq: Once | INTRAMUSCULAR | Status: DC

## 2011-07-31 ENCOUNTER — Ambulatory Visit: Payer: Managed Care, Other (non HMO) | Admitting: Family Medicine

## 2011-08-01 ENCOUNTER — Ambulatory Visit (INDEPENDENT_AMBULATORY_CARE_PROVIDER_SITE_OTHER): Payer: Managed Care, Other (non HMO) | Admitting: Family Medicine

## 2011-08-01 ENCOUNTER — Encounter: Payer: Self-pay | Admitting: Family Medicine

## 2011-08-01 VITALS — BP 120/76 | HR 73 | Temp 98.6°F | Wt 240.4 lb

## 2011-08-01 DIAGNOSIS — F988 Other specified behavioral and emotional disorders with onset usually occurring in childhood and adolescence: Secondary | ICD-10-CM

## 2011-08-01 MED ORDER — AMPHETAMINE-DEXTROAMPHETAMINE 20 MG PO TABS: ORAL_TABLET | ORAL | Status: DC

## 2011-08-01 MED ORDER — AMPHETAMINE-DEXTROAMPHET ER 30 MG PO CP24: 30.0000 mg | ORAL_CAPSULE | ORAL | Status: DC

## 2011-08-01 NOTE — Progress Notes (Signed)
  Subjective:    Patient ID: Erin Howe, female    DOB: [birth year], 41 y.o.   MRN: [id number]  HPI Pt here f/u add. Pt feels like it is not lasting long enough.  It wears off about 1 pm.      Review of Systems As above    Objective:   Physical Exam  Constitutional: She is oriented to person, place, and time. She appears well-developed and well-nourished.  Cardiovascular: Normal rate, regular rhythm and normal heart sounds.   No murmur heard. Pulmonary/Chest: Effort normal and breath sounds normal.  Musculoskeletal: She exhibits no edema.  Neurological: She is alert and oriented to person, place, and time.  Psychiatric: She has a normal mood and affect. Her behavior is normal. Judgment and thought content normal.          Assessment & Plan:

## 2011-08-01 NOTE — Assessment & Plan Note (Signed)
Refill adderall xr Add adderall 20 mg at about 1-2 pm  rto 6 months

## 2011-08-01 NOTE — Patient Instructions (Signed)
Attention Deficit Hyperactivity Disorder Attention deficit hyperactivity disorder (ADHD) is a problem with behavior issues based on the way the brain functions (neurobehavioral disorder). It is a common reason for behavior and academic problems in school. CAUSES  The cause of ADHD is unknown in most cases. It may run in families. It sometimes can be associated with learning disabilities and other behavioral problems. SYMPTOMS  There are 3 types of ADHD. The 3 types and some of the symptoms include:  Inattentive   Gets bored or distracted easily.   Loses or forgets things. Forgets to hand in homework.   Has trouble organizing or completing tasks.   Difficulty staying on task.   An inability to organize daily tasks and school work.   Leaving projects, chores, or homework unfinished.   Trouble paying attention or responding to details. Careless mistakes.   Difficulty following directions. Often seems like is not listening.   Dislikes activities that require sustained attention (like chores or homework).   Hyperactive-impulsive   Feels like it is impossible to sit still or stay in a seat. Fidgeting with hands and feet.   Trouble waiting turn.   Talking too much or out of turn. Interruptive.   Speaks or acts impulsively.   Aggressive, disruptive behavior.   Constantly busy or on the go, noisy.   Combined   Has symptoms of both of the above.  Often children with ADHD feel discouraged about themselves and with school. They often perform well below their abilities in school. These symptoms can cause problems in home, school, and in relationships with peers. As children get older, the excess motor activities can calm down, but the problems with paying attention and staying organized persist. Most children do not outgrow ADHD but with good treatment can learn to cope with the symptoms. DIAGNOSIS  When ADHD is suspected, the diagnosis should be made by professionals trained in  ADHD.  Diagnosis will include:  Ruling out other reasons for the child's behavior.   The caregivers will check with the child's school and check their medical records.   They will talk to teachers and parents.   Behavior rating scales for the child will be filled out by those dealing with the child on a daily basis.  A diagnosis is made only after all information has been considered. TREATMENT  Treatment usually includes behavioral treatment often along with medicines. It may include stimulant medicines. The stimulant medicines decrease impulsivity and hyperactivity and increase attention. Other medicines used include antidepressants and certain blood pressure medicines. Most experts agree that treatment for ADHD should address all aspects of the child's functioning. Treatment should not be limited to the use of medicines alone. Treatment should include structured classroom management. The parents must receive education to address rewarding good behavior, discipline, and limit-setting. Tutoring or behavioral therapy or both should be available for the child. If untreated, the disorder can have long-term serious effects into adolescence and adulthood. HOME CARE INSTRUCTIONS   Often with ADHD there is a lot of frustration among the family in dealing with the illness. There is often blame and anger that is not warranted. This is a life long illness. There is no way to prevent ADHD. In many cases, because the problem affects the family as a whole, the entire family may need help. A therapist can help the family find better ways to handle the disruptive behaviors and promote change. If the child is young, most of the therapist's work is with the parents. Parents will   learn techniques for coping with and improving their child's behavior. Sometimes only the child with the ADHD needs counseling. Your caregivers can help you make these decisions.   Children with ADHD may need help in organizing. Some  helpful tips include:   Keep routines the same every day from wake-up time to bedtime. Schedule everything. This includes homework and playtime. This should include outdoor and indoor recreation. Keep the schedule on the refrigerator or a bulletin board where it is frequently seen. Mark schedule changes as far in advance as possible.   Have a place for everything and keep everything in its place. This includes clothing, backpacks, and school supplies.   Encourage writing down assignments and bringing home needed books.   Offer your child a well-balanced diet. Breakfast is especially important for school performance. Children should avoid drinks with caffeine including:   Soft drinks.   Coffee.   Tea.   However, some older children (adolescents) may find these drinks helpful in improving their attention.   Children with ADHD need consistent rules that they can understand and follow. If rules are followed, give small rewards. Children with ADHD often receive, and expect, criticism. Look for good behavior and praise it. Set realistic goals. Give clear instructions. Look for activities that can foster success and self-esteem. Make time for pleasant activities with your child. Give lots of affection.   Parents are their children's greatest advocates. Learn as much as possible about ADHD. This helps you become a stronger and better advocate for your child. It also helps you educate your child's teachers and instructors if they feel inadequate in these areas. Parent support groups are often helpful. A national group with local chapters is called CHADD (Children and Adults with Attention Deficit Hyperactivity Disorder).  PROGNOSIS  There is no cure for ADHD. Children with the disorder seldom outgrow it. Many find adaptive ways to accommodate the ADHD as they mature. SEEK MEDICAL CARE IF:  Your child has repeated muscle twitches, cough or speech outbursts.   Your child has sleep problems.   Your  child has a marked loss of appetite.   Your child develops depression.   Your child has new or worsening behavioral problems.   Your child develops dizziness.   Your child has a racing heart.   Your child has stomach pains.   Your child develops headaches.  Document Released: 12/09/2001 Document Revised: 12/08/2010 Document Reviewed: 07/22/2007 [employer] Patient Information 2012 [employer]. 

## 2011-08-22 ENCOUNTER — Other Ambulatory Visit: Payer: Self-pay | Admitting: *Deleted

## 2011-08-22 DIAGNOSIS — F988 Other specified behavioral and emotional disorders with onset usually occurring in childhood and adolescence: Secondary | ICD-10-CM

## 2011-08-22 NOTE — Telephone Encounter (Signed)
Pt request 90 day supply to mail in to mail order and a 30 day supply to take to local pharmacy.Please advise   Last filled 08-01-11 #90, #30, last OV

## 2011-08-22 NOTE — Telephone Encounter (Signed)
Ok to do 90 day supply

## 2011-08-23 NOTE — Telephone Encounter (Signed)
Patient is aware it is too soon to pick up the Rx and she agreed to pick it up on Wednesday 8/28

## 2011-08-30 MED ORDER — AMPHETAMINE-DEXTROAMPHETAMINE 20 MG PO TABS: ORAL_TABLET | ORAL | Status: DC

## 2011-08-30 MED ORDER — AMPHETAMINE-DEXTROAMPHET ER 30 MG PO CP24: 30.0000 mg | ORAL_CAPSULE | ORAL | Status: DC

## 2011-08-30 NOTE — Telephone Encounter (Signed)
Vm left making patient aware RX ready for pick up.    KP

## 2011-10-09 ENCOUNTER — Other Ambulatory Visit: Payer: Self-pay | Admitting: Family Medicine

## 2011-10-09 ENCOUNTER — Ambulatory Visit (INDEPENDENT_AMBULATORY_CARE_PROVIDER_SITE_OTHER): Payer: Managed Care, Other (non HMO) | Admitting: Family Medicine

## 2011-10-09 ENCOUNTER — Encounter: Payer: Self-pay | Admitting: Family Medicine

## 2011-10-09 VITALS — BP 120/74 | HR 75 | Temp 98.6°F | Ht 65.0 in | Wt 241.4 lb

## 2011-10-09 DIAGNOSIS — K219 Gastro-esophageal reflux disease without esophagitis: Secondary | ICD-10-CM

## 2011-10-09 DIAGNOSIS — Z9884 Bariatric surgery status: Secondary | ICD-10-CM

## 2011-10-09 DIAGNOSIS — Z0000 Encounter for general adult medical examination without abnormal findings: Secondary | ICD-10-CM

## 2011-10-09 DIAGNOSIS — Z23 Encounter for immunization: Secondary | ICD-10-CM

## 2011-10-09 DIAGNOSIS — F988 Other specified behavioral and emotional disorders with onset usually occurring in childhood and adolescence: Secondary | ICD-10-CM

## 2011-10-09 LAB — TSH: TSH: 1.87 u[IU]/mL (ref 0.35–5.50)

## 2011-10-09 LAB — CBC WITH DIFFERENTIAL/PLATELET
Basophils Absolute: 0 10*3/uL (ref 0.0–0.1)
Basophils Relative: 0.7 % (ref 0.0–3.0)
Eosinophils Absolute: 0.1 10*3/uL (ref 0.0–0.7)
Hemoglobin: 9.2 g/dL — ABNORMAL LOW (ref 12.0–15.0)
Lymphocytes Relative: 23 % (ref 12.0–46.0)
Lymphs Abs: 1.4 10*3/uL (ref 0.7–4.0)
MCHC: 30.3 g/dL (ref 30.0–36.0)
MCV: 62.2 fl — ABNORMAL LOW (ref 78.0–100.0)
Monocytes Absolute: 0.5 10*3/uL (ref 0.1–1.0)
Neutro Abs: 4.1 10*3/uL (ref 1.4–7.7)
RBC: 4.91 Mil/uL (ref 3.87–5.11)
RDW: 18.9 % — ABNORMAL HIGH (ref 11.5–14.6)

## 2011-10-09 LAB — LIPID PANEL
Cholesterol: 125 mg/dL (ref 0–200)
HDL: 44 mg/dL (ref >39.00)
Triglycerides: 51 mg/dL (ref 0.0–149.0)
VLDL: 10.2 mg/dL (ref 0.0–40.0)

## 2011-10-09 LAB — POCT URINALYSIS DIPSTICK
Bilirubin, UA: NEGATIVE
Blood, UA: NEGATIVE
Glucose, UA: NEGATIVE
Ketones, UA: NEGATIVE
Leukocytes, UA: NEGATIVE
Nitrite, UA: NEGATIVE
Protein, UA: NEGATIVE
Spec Grav, UA: 1.02
Urobilinogen, UA: 0.2
pH, UA: 6.5

## 2011-10-09 LAB — HEPATIC FUNCTION PANEL
ALT: 14 U/L (ref 0–35)
AST: 14 U/L (ref 0–37)
Albumin: 3.4 g/dL — ABNORMAL LOW (ref 3.5–5.2)
Alkaline Phosphatase: 71 U/L (ref 39–117)
Bilirubin, Direct: 0.1 mg/dL (ref 0.0–0.3)
Total Bilirubin: 0.2 mg/dL — ABNORMAL LOW (ref 0.3–1.2)
Total Protein: 6.8 g/dL (ref 6.0–8.3)

## 2011-10-09 LAB — BASIC METABOLIC PANEL WITH GFR
BUN: 10 mg/dL (ref 6–23)
CO2: 27 meq/L (ref 19–32)
Calcium: 8.6 mg/dL (ref 8.4–10.5)
Chloride: 103 meq/L (ref 96–112)
Creatinine, Ser: 0.6 mg/dL (ref 0.4–1.2)
GFR: 125.28 mL/min
Glucose, Bld: 94 mg/dL (ref 70–99)
Potassium: 3.8 meq/L (ref 3.5–5.1)
Sodium: 136 meq/L (ref 135–145)

## 2011-10-09 LAB — VITAMIN B12: Vitamin B-12: 413 pg/mL (ref 211–911)

## 2011-10-09 MED ORDER — AMPHETAMINE-DEXTROAMPHET ER 20 MG PO CP24: 20.0000 mg | ORAL_CAPSULE | ORAL | Status: DC

## 2011-10-09 MED ORDER — OMEPRAZOLE 40 MG PO CPDR: 40.0000 mg | DELAYED_RELEASE_CAPSULE | Freq: Every day | ORAL | Status: DC

## 2011-10-09 MED ORDER — OMEPRAZOLE 40 MG PO CPDR
40.0000 mg | DELAYED_RELEASE_CAPSULE | Freq: Every day | ORAL | Status: DC
Start: 2011-10-09 — End: 2011-10-09

## 2011-10-09 MED ORDER — AMPHETAMINE-DEXTROAMPHETAMINE 20 MG PO TABS
ORAL_TABLET | ORAL | Status: DC
Start: 2011-10-09 — End: 2011-12-18

## 2011-10-09 NOTE — Assessment & Plan Note (Signed)
Refill meds

## 2011-10-09 NOTE — Assessment & Plan Note (Signed)
Check labs 

## 2011-10-09 NOTE — Progress Notes (Signed)
Subjective:     Erin Howe is a 41 y.o. female and is here for a comprehensive physical exam. The patient reports no problems.  History   Social History  . Marital Status: Married    Spouse Name: N/A    Number of Children: N/A  . Years of Education: N/A   Occupational History  . 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 Smoking status: Never Smoker   . Smokeless tobacco: Not on file  . Alcohol Use: No  . Drug Use: No  . Sexually Active: Yes -- Female partner(s)   Other Topics Concern  . Not on file   Social History Narrative   Exercise-- walking, water aerobics   Health Maintenance  Topic Date Due  . Mammogram  04/10/1986  . Pap Smear  07/21/2012  . Influenza Vaccine  09/02/2012  . Tetanus/tdap  07/21/2020    The following portions of the patient's history were reviewed and updated as appropriate:  She  has a past medical history of Diabetes mellitus type II. She  does not have any pertinent problems on file. She  has past surgical history that includes Gastric bypass (02-03-08); Wisdom tooth extraction; and Refractive surgery (2006). Her family history includes Atrial fibrillation in her mother; Deep vein thrombosis in her mother; Diabetes in her mother and sister; Heart disease in her maternal grandmother; Heart failure in her mother; Hyperlipidemia in her father and mother; Hypertension in her father and mother; and Stroke in her maternal grandfather. She  reports that she has never smoked. She does not have any smokeless tobacco history on file. She reports that she does not drink alcohol or use illicit drugs. She has a current medication list which includes the following prescription(s): amphetamine-dextroamphetamine, cyanocobalamin, ferrous sulfate, omeprazole, amphetamine-dextroamphetamine, and amphetamine-dextroamphetamine. Current Outpatient Prescriptions on File Prior to Visit  Medication Sig Dispense Refill  . amphetamine-dextroamphetamine  (ADDERALL) 20 MG tablet 1 po q afternoon  90 tablet  0  . cyanocobalamin (,VITAMIN B-12,) 1000 MCG/ML injection Inject 1 mL (1,000 mcg total) into the skin once. Weekly x4 then 1ml  1 mL  11  . ferrous sulfate 325 (65 FE) MG EC tablet Take 1 tablet (325 mg total) by mouth daily.  30 tablet  0  . omeprazole (PRILOSEC) 40 MG capsule Take 1 capsule (40 mg total) by mouth daily.  90 capsule  3  . amphetamine-dextroamphetamine (ADDERALL XR) 30 MG 24 hr capsule Take 1 capsule (30 mg total) by mouth every morning.  90 capsule  0  . amphetamine-dextroamphetamine (ADDERALL XR) 30 MG 24 hr capsule Take 1 capsule (30 mg total) by mouth every morning.  30 capsule  0   She is allergic to amoxicillin; penicillins; and tetanus toxoid..  Review of Systems Review of Systems  Constitutional: Negative for activity change, appetite change and fatigue.  HENT: Negative for hearing loss, congestion, tinnitus and ear discharge.  dentist q68m Eyes: Negative for visual disturbance (see optho q1y -- vision corrected to 20/20 with glasses).  Respiratory: Negative for cough, chest tightness and shortness of breath.   Cardiovascular: Negative for chest pain, palpitations and leg swelling.  Gastrointestinal: Negative for abdominal pain, diarrhea, constipation and abdominal distention.  Genitourinary: Negative for urgency, frequency, decreased urine volume and difficulty urinating.  Musculoskeletal: Negative for back pain, arthralgias and gait problem.  Skin: Negative for color change, pallor and rash.  Neurological: Negative for dizziness, Satcher-headedness, numbness and headaches.  Hematological: Negative for adenopathy.  Does not bruise/bleed easily.  Psychiatric/Behavioral: Negative for suicidal ideas, confusion, sleep disturbance, self-injury, dysphoric mood, decreased concentration and agitation.       Objective:    BP 120/74  Pulse 75  Temp 98.6 F (37 C) (Oral)  Ht 5\' 5"  (1.651 m)  Wt 241 lb 6.4 oz (109.498  kg)  BMI 40.17 kg/m2  SpO2 98% General appearance: alert, cooperative, appears stated age and no distress Head: Normocephalic, without obvious abnormality, atraumatic Eyes: conjunctivae/corneas clear. PERRL, EOM's intact. Fundi benign. Ears: normal TM's and external ear canals both ears Nose: Nares normal. Septum midline. Mucosa normal. No drainage or sinus tenderness. Throat: lips, mucosa, and tongue normal; teeth and gums normal Neck: no adenopathy, no carotid bruit, no JVD, supple, symmetrical, trachea midline and thyroid not enlarged, symmetric, no tenderness/mass/nodules Back: symmetric, no curvature. ROM normal. No CVA tenderness. Lungs: clear to auscultation bilaterally Breasts: gyn Heart: regular rate and rhythm, S1, S2 normal, no murmur, click, rub or gallop Abdomen: soft, non-tender; bowel sounds normal; no masses,  no organomegaly Pelvic: deferred-gyn Extremities: extremities normal, atraumatic, no cyanosis or edema Pulses: 2+ and symmetric Skin: Skin color, texture, turgor normal. No rashes or lesions Lymph nodes: Cervical, supraclavicular, and axillary nodes normal. Neurologic: Alert and oriented X 3, normal strength and tone. Normal symmetric reflexes. Normal coordination and gait psych-- no depression, no anxiety    Assessment:    Healthy female exam.      Plan:     See After Visit Summary for Counseling Recommendations

## 2011-10-09 NOTE — Patient Instructions (Addendum)
Preventive Care for Adults, Female A healthy lifestyle and preventive care can promote health and wellness. Preventive health guidelines for women include the following key practices.  A routine yearly physical is a good way to check with your caregiver about your health and preventive screening. It is a chance to share any concerns and updates on your health, and to receive a thorough exam.  Visit your dentist for a routine exam and preventive care every 6 months. Brush your teeth twice a day and floss once a day. Good oral hygiene prevents tooth decay and gum disease.  The frequency of eye exams is based on your age, health, family medical history, use of contact lenses, and other factors. Follow your caregiver's recommendations for frequency of eye exams.  Eat a healthy diet. Foods like vegetables, fruits, whole grains, low-fat dairy products, and lean protein foods contain the nutrients you need without too many calories. Decrease your intake of foods high in solid fats, added sugars, and salt. Eat the right amount of calories for you.Get information about a proper diet from your caregiver, if necessary.  Regular physical exercise is one of the most important things you can do for your health. Most adults should get at least 150 minutes of moderate-intensity exercise (any activity that increases your heart rate and causes you to sweat) each week. In addition, most adults need muscle-strengthening exercises on 2 or more days a week.  Maintain a healthy weight. The body mass index (BMI) is a screening tool to identify possible weight problems. It provides an estimate of body fat based on height and weight. Your caregiver can help determine your BMI, and can help you achieve or maintain a healthy weight.For adults 20 years and older:  A BMI below 18.5 is considered underweight.  A BMI of 18.5 to 24.9 is normal.  A BMI of 25 to 29.9 is considered overweight.  A BMI of 30 and above is  considered obese.  Maintain normal blood lipids and cholesterol levels by exercising and minimizing your intake of saturated fat. Eat a balanced diet with plenty of fruit and vegetables. Blood tests for lipids and cholesterol should begin at age 20 and be repeated every 5 years. If your lipid or cholesterol levels are high, you are over 50, or you are at high risk for heart disease, you may need your cholesterol levels checked more frequently.Ongoing high lipid and cholesterol levels should be treated with medicines if diet and exercise are not effective.  If you smoke, find out from your caregiver how to quit. If you do not use tobacco, do not start.  If you are pregnant, do not drink alcohol. If you are breastfeeding, be very cautious about drinking alcohol. If you are not pregnant and choose to drink alcohol, do not exceed 1 drink per day. One drink is considered to be 12 ounces (355 mL) of beer, 5 ounces (148 mL) of wine, or 1.5 ounces (44 mL) of liquor.  Avoid use of street drugs. Do not share needles with anyone. Ask for help if you need support or instructions about stopping the use of drugs.  High blood pressure causes heart disease and increases the risk of stroke. Your blood pressure should be checked at least every 1 to 2 years. Ongoing high blood pressure should be treated with medicines if weight loss and exercise are not effective.  If you are 55 to 43 years old, ask your caregiver if you should take aspirin to prevent strokes.  Diabetes   screening involves taking a blood sample to check your fasting blood sugar level. This should be done once every 3 years, after age 45, if you are within normal weight and without risk factors for diabetes. Testing should be considered at a younger age or be carried out more frequently if you are overweight and have at least 1 risk factor for diabetes.  Breast cancer screening is essential preventive care for women. You should practice "breast  self-awareness." This means understanding the normal appearance and feel of your breasts and may include breast self-examination. Any changes detected, no matter how small, should be reported to a caregiver. Women in their 20s and 30s should have a clinical breast exam (CBE) by a caregiver as part of a regular health exam every 1 to 3 years. After age 40, women should have a CBE every year. Starting at age 40, women should consider having a mammography (breast X-ray test) every year. Women who have a family history of breast cancer should talk to their caregiver about genetic screening. Women at a high risk of breast cancer should talk to their caregivers about having magnetic resonance imaging (MRI) and a mammography every year.  The Pap test is a screening test for cervical cancer. A Pap test can show cell changes on the cervix that might become cervical cancer if left untreated. A Pap test is a procedure in which cells are obtained and examined from the lower end of the uterus (cervix).  Women should have a Pap test starting at age 21.  Between ages 21 and 29, Pap tests should be repeated every 2 years.  Beginning at age 30, you should have a Pap test every 3 years as long as the past 3 Pap tests have been normal.  Some women have medical problems that increase the chance of getting cervical cancer. Talk to your caregiver about these problems. It is especially important to talk to your caregiver if a new problem develops soon after your last Pap test. In these cases, your caregiver may recommend more frequent screening and Pap tests.  The above recommendations are the same for women who have or have not gotten the vaccine for human papillomavirus (HPV).  If you had a hysterectomy for a problem that was not cancer or a condition that could lead to cancer, then you no longer need Pap tests. Even if you no longer need a Pap test, a regular exam is a good idea to make sure no other problems are  starting.  If you are between ages 65 and 70, and you have had normal Pap tests going back 10 years, you no longer need Pap tests. Even if you no longer need a Pap test, a regular exam is a good idea to make sure no other problems are starting.  If you have had past treatment for cervical cancer or a condition that could lead to cancer, you need Pap tests and screening for cancer for at least 20 years after your treatment.  If Pap tests have been discontinued, risk factors (such as a new sexual partner) need to be reassessed to determine if screening should be resumed.  The HPV test is an additional test that may be used for cervical cancer screening. The HPV test looks for the virus that can cause the cell changes on the cervix. The cells collected during the Pap test can be tested for HPV. The HPV test could be used to screen women aged 30 years and older, and should   be used in women of any age who have unclear Pap test results. After the age of 30, women should have HPV testing at the same frequency as a Pap test.  Colorectal cancer can be detected and often prevented. Most routine colorectal cancer screening begins at the age of 50 and continues through age 75. However, your caregiver may recommend screening at an earlier age if you have risk factors for colon cancer. On a yearly basis, your caregiver may provide home test kits to check for hidden blood in the stool. Use of a small camera at the end of a tube, to directly examine the colon (sigmoidoscopy or colonoscopy), can detect the earliest forms of colorectal cancer. Talk to your caregiver about this at age 50, when routine screening begins. Direct examination of the colon should be repeated every 5 to 10 years through age 75, unless early forms of pre-cancerous polyps or small growths are found.  Hepatitis C blood testing is recommended for all people born from 1945 through 1965 and any individual with known risks for hepatitis C.  Practice  safe sex. Use condoms and avoid high-risk sexual practices to reduce the spread of sexually transmitted infections (STIs). STIs include gonorrhea, chlamydia, syphilis, trichomonas, herpes, HPV, and human immunodeficiency virus (HIV). Herpes, HIV, and HPV are viral illnesses that have no cure. They can result in disability, cancer, and death. Sexually active women aged 25 and younger should be checked for chlamydia. Older women with new or multiple partners should also be tested for chlamydia. Testing for other STIs is recommended if you are sexually active and at increased risk.  Osteoporosis is a disease in which the bones lose minerals and strength with aging. This can result in serious bone fractures. The risk of osteoporosis can be identified using a bone density scan. Women ages 65 and over and women at risk for fractures or osteoporosis should discuss screening with their caregivers. Ask your caregiver whether you should take a calcium supplement or vitamin D to reduce the rate of osteoporosis.  Menopause can be associated with physical symptoms and risks. Hormone replacement therapy is available to decrease symptoms and risks. You should talk to your caregiver about whether hormone replacement therapy is right for you.  Use sunscreen with sun protection factor (SPF) of 30 or more. Apply sunscreen liberally and repeatedly throughout the day. You should seek shade when your shadow is shorter than you. Protect yourself by wearing long sleeves, pants, a wide-brimmed hat, and sunglasses year round, whenever you are outdoors.  Once a month, do a whole body skin exam, using a mirror to look at the skin on your back. Notify your caregiver of new moles, moles that have irregular borders, moles that are larger than a pencil eraser, or moles that have changed in shape or color.  Stay current with required immunizations.  Influenza. You need a dose every fall (or winter). The composition of the flu vaccine  changes each year, so being vaccinated once is not enough.  Pneumococcal polysaccharide. You need 1 to 2 doses if you smoke cigarettes or if you have certain chronic medical conditions. You need 1 dose at age 65 (or older) if you have never been vaccinated.  Tetanus, diphtheria, pertussis (Tdap, Td). Get 1 dose of Tdap vaccine if you are younger than age 65, are over 65 and have contact with an infant, are a healthcare worker, are pregnant, or simply want to be protected from whooping cough. After that, you need a Td   booster dose every 10 years. Consult your caregiver if you have not had at least 3 tetanus and diphtheria-containing shots sometime in your life or have a deep or dirty wound.  HPV. You need this vaccine if you are a woman age 26 or younger. The vaccine is given in 3 doses over 6 months.  Measles, mumps, rubella (MMR). You need at least 1 dose of MMR if you were born in 1957 or later. You may also need a second dose.  Meningococcal. If you are age 19 to 21 and a first-year college student living in a residence hall, or have one of several medical conditions, you need to get vaccinated against meningococcal disease. You may also need additional booster doses.  Zoster (shingles). If you are age 60 or older, you should get this vaccine.  Varicella (chickenpox). If you have never had chickenpox or you were vaccinated but received only 1 dose, talk to your caregiver to find out if you need this vaccine.  Hepatitis A. You need this vaccine if you have a specific risk factor for hepatitis A virus infection or you simply wish to be protected from this disease. The vaccine is usually given as 2 doses, 6 to 18 months apart.  Hepatitis B. You need this vaccine if you have a specific risk factor for hepatitis B virus infection or you simply wish to be protected from this disease. The vaccine is given in 3 doses, usually over 6 months. Preventive Services / Frequency Ages 19 to 39  Blood  pressure check.** / Every 1 to 2 years.  Lipid and cholesterol check.** / Every 5 years beginning at age 20.  Clinical breast exam.** / Every 3 years for women in their 20s and 30s.  Pap test.** / Every 2 years from ages 21 through 29. Every 3 years starting at age 30 through age 65 or 70 with a history of 3 consecutive normal Pap tests.  HPV screening.** / Every 3 years from ages 30 through ages 65 to 70 with a history of 3 consecutive normal Pap tests.  Hepatitis C blood test.** / For any individual with known risks for hepatitis C.  Skin self-exam. / Monthly.  Influenza immunization.** / Every year.  Pneumococcal polysaccharide immunization.** / 1 to 2 doses if you smoke cigarettes or if you have certain chronic medical conditions.  Tetanus, diphtheria, pertussis (Tdap, Td) immunization. / A one-time dose of Tdap vaccine. After that, you need a Td booster dose every 10 years.  HPV immunization. / 3 doses over 6 months, if you are 26 and younger.  Measles, mumps, rubella (MMR) immunization. / You need at least 1 dose of MMR if you were born in 1957 or later. You may also need a second dose.  Meningococcal immunization. / 1 dose if you are age 19 to 21 and a first-year college student living in a residence hall, or have one of several medical conditions, you need to get vaccinated against meningococcal disease. You may also need additional booster doses.  Varicella immunization.** / Consult your caregiver.  Hepatitis A immunization.** / Consult your caregiver. 2 doses, 6 to 18 months apart.  Hepatitis B immunization.** / Consult your caregiver. 3 doses usually over 6 months. Ages 40 to 64  Blood pressure check.** / Every 1 to 2 years.  Lipid and cholesterol check.** / Every 5 years beginning at age 20.  Clinical breast exam.** / Every year after age 40.  Mammogram.** / Every year beginning at age 40   and continuing for as long as you are in good health. Consult with your  caregiver.  Pap test.** / Every 3 years starting at age 30 through age 65 or 70 with a history of 3 consecutive normal Pap tests.  HPV screening.** / Every 3 years from ages 30 through ages 65 to 70 with a history of 3 consecutive normal Pap tests.  Fecal occult blood test (FOBT) of stool. / Every year beginning at age 50 and continuing until age 75. You may not need to do this test if you get a colonoscopy every 10 years.  Flexible sigmoidoscopy or colonoscopy.** / Every 5 years for a flexible sigmoidoscopy or every 10 years for a colonoscopy beginning at age 50 and continuing until age 75.  Hepatitis C blood test.** / For all people born from 1945 through 1965 and any individual with known risks for hepatitis C.  Skin self-exam. / Monthly.  Influenza immunization.** / Every year.  Pneumococcal polysaccharide immunization.** / 1 to 2 doses if you smoke cigarettes or if you have certain chronic medical conditions.  Tetanus, diphtheria, pertussis (Tdap, Td) immunization.** / A one-time dose of Tdap vaccine. After that, you need a Td booster dose every 10 years.  Measles, mumps, rubella (MMR) immunization. / You need at least 1 dose of MMR if you were born in 1957 or later. You may also need a second dose.  Varicella immunization.** / Consult your caregiver.  Meningococcal immunization.** / Consult your caregiver.  Hepatitis A immunization.** / Consult your caregiver. 2 doses, 6 to 18 months apart.  Hepatitis B immunization.** / Consult your caregiver. 3 doses, usually over 6 months. Ages 65 and over  Blood pressure check.** / Every 1 to 2 years.  Lipid and cholesterol check.** / Every 5 years beginning at age 20.  Clinical breast exam.** / Every year after age 40.  Mammogram.** / Every year beginning at age 40 and continuing for as long as you are in good health. Consult with your caregiver.  Pap test.** / Every 3 years starting at age 30 through age 65 or 70 with a 3  consecutive normal Pap tests. Testing can be stopped between 65 and 70 with 3 consecutive normal Pap tests and no abnormal Pap or HPV tests in the past 10 years.  HPV screening.** / Every 3 years from ages 30 through ages 65 or 70 with a history of 3 consecutive normal Pap tests. Testing can be stopped between 65 and 70 with 3 consecutive normal Pap tests and no abnormal Pap or HPV tests in the past 10 years.  Fecal occult blood test (FOBT) of stool. / Every year beginning at age 50 and continuing until age 75. You may not need to do this test if you get a colonoscopy every 10 years.  Flexible sigmoidoscopy or colonoscopy.** / Every 5 years for a flexible sigmoidoscopy or every 10 years for a colonoscopy beginning at age 50 and continuing until age 75.  Hepatitis C blood test.** / For all people born from 1945 through 1965 and any individual with known risks for hepatitis C.  Osteoporosis screening.** / A one-time screening for women ages 65 and over and women at risk for fractures or osteoporosis.  Skin self-exam. / Monthly.  Influenza immunization.** / Every year.  Pneumococcal polysaccharide immunization.** / 1 dose at age 65 (or older) if you have never been vaccinated.  Tetanus, diphtheria, pertussis (Tdap, Td) immunization. / A one-time dose of Tdap vaccine if you are over   65 and have contact with an infant, are a healthcare worker, or simply want to be protected from whooping cough. After that, you need a Td booster dose every 10 years.  Varicella immunization.** / Consult your caregiver.  Meningococcal immunization.** / Consult your caregiver.  Hepatitis A immunization.** / Consult your caregiver. 2 doses, 6 to 18 months apart.  Hepatitis B immunization.** / Check with your caregiver. 3 doses, usually over 6 months. ** Family history and personal history of risk and conditions may change your caregiver's recommendations. Document Released: 02/14/2001 Document Revised: 03/13/2011  Document Reviewed: 05/16/2010 [employer] Patient Information 2013 [employer].  

## 2011-10-12 LAB — VITAMIN D 1,25 DIHYDROXY
Vitamin D2 1, 25 (OH)2: <8 pg/mL
Vitamin D3 1, 25 (OH)2: 61 pg/mL

## 2011-12-18 ENCOUNTER — Other Ambulatory Visit: Payer: Self-pay | Admitting: *Deleted

## 2011-12-18 DIAGNOSIS — F988 Other specified behavioral and emotional disorders with onset usually occurring in childhood and adolescence: Secondary | ICD-10-CM

## 2011-12-18 MED ORDER — AMPHETAMINE-DEXTROAMPHET ER 30 MG PO CP24: 30.0000 mg | ORAL_CAPSULE | ORAL | Status: DC

## 2011-12-18 MED ORDER — AMPHETAMINE-DEXTROAMPHETAMINE 20 MG PO TABS: ORAL_TABLET | ORAL | Status: DC

## 2011-12-18 NOTE — Telephone Encounter (Signed)
Medication printed and patient made aware they will need to pick them up to sign the contract.      KP

## 2011-12-18 NOTE — Telephone Encounter (Signed)
Patients husband called needs refill on her Adderrall, Iron and B-12. CB # [phone]

## 2011-12-18 NOTE — Telephone Encounter (Signed)
msg left to call the office to verify Adderall 2 doses are on the system.      KP

## 2012-04-05 ENCOUNTER — Encounter: Payer: Self-pay | Admitting: Lab

## 2012-04-08 ENCOUNTER — Ambulatory Visit (INDEPENDENT_AMBULATORY_CARE_PROVIDER_SITE_OTHER): Payer: Managed Care, Other (non HMO) | Admitting: Family Medicine

## 2012-04-08 ENCOUNTER — Encounter: Payer: Self-pay | Admitting: Family Medicine

## 2012-04-08 VITALS — BP 120/78 | HR 104 | Temp 98.7°F | Wt 254.2 lb

## 2012-04-08 DIAGNOSIS — Z9884 Bariatric surgery status: Secondary | ICD-10-CM

## 2012-04-08 DIAGNOSIS — Z1239 Encounter for other screening for malignant neoplasm of breast: Secondary | ICD-10-CM

## 2012-04-08 DIAGNOSIS — E538 Deficiency of other specified B group vitamins: Secondary | ICD-10-CM

## 2012-04-08 DIAGNOSIS — R5381 Other malaise: Secondary | ICD-10-CM

## 2012-04-08 DIAGNOSIS — J309 Allergic rhinitis, unspecified: Secondary | ICD-10-CM

## 2012-04-08 DIAGNOSIS — R319 Hematuria, unspecified: Secondary | ICD-10-CM

## 2012-04-08 DIAGNOSIS — R5383 Other fatigue: Secondary | ICD-10-CM

## 2012-04-08 DIAGNOSIS — J302 Other seasonal allergic rhinitis: Secondary | ICD-10-CM

## 2012-04-08 DIAGNOSIS — F988 Other specified behavioral and emotional disorders with onset usually occurring in childhood and adolescence: Secondary | ICD-10-CM

## 2012-04-08 LAB — POCT URINALYSIS DIPSTICK
Ketones, UA: NEGATIVE
Spec Grav, UA: 1.015
pH, UA: 7

## 2012-04-08 MED ORDER — AMPHETAMINE-DEXTROAMPHET ER 30 MG PO CP24: 30.0000 mg | ORAL_CAPSULE | ORAL | Status: DC

## 2012-04-08 MED ORDER — CYANOCOBALAMIN 1000 MCG/ML IJ SOLN: 1000.0000 ug | Freq: Once | INTRAMUSCULAR | Status: DC

## 2012-04-08 MED ORDER — AMPHETAMINE-DEXTROAMPHETAMINE 20 MG PO TABS: ORAL_TABLET | ORAL | Status: DC

## 2012-04-08 MED ORDER — INTEGRA PLUS PO CAPS: 1.0000 | ORAL_CAPSULE | Freq: Every day | ORAL | Status: DC

## 2012-04-08 MED ORDER — LORATADINE 10 MG PO TABS: 10.0000 mg | ORAL_TABLET | Freq: Every day | ORAL | Status: AC

## 2012-04-08 NOTE — Assessment & Plan Note (Signed)
Refill meds stable 

## 2012-04-08 NOTE — Patient Instructions (Signed)

## 2012-04-08 NOTE — Assessment & Plan Note (Signed)
Recheck labs 

## 2012-04-08 NOTE — Progress Notes (Signed)
  Subjective:    Patient ID: Erin Howe, female    DOB: 24-Apr-1970, 42 y.o.   MRN: [id number]  HPI Pt here to  F/u ADD med and have labs.  No complaints.  Review of Systems As above     Objective:   Physical Exam BP 120/78  Pulse 104  Temp(Src) 98.7 F (37.1 C) (Oral)  Wt 254 lb 3.2 oz (115.304 kg)  BMI 42.3 kg/m2  SpO2 98% General appearance: alert, cooperative, appears stated age and no distress Throat: lips, mucosa, and tongue normal; teeth and gums normal Neck: no adenopathy, supple, symmetrical, trachea midline and thyroid not enlarged, symmetric, no tenderness/mass/nodules Lungs: clear to auscultation bilaterally Heart: S1, S2 normal        Assessment & Plan:

## 2012-04-09 LAB — BASIC METABOLIC PANEL
BUN: 10 mg/dL (ref 6–23)
CO2: 28 mEq/L (ref 19–32)
Calcium: 8.5 mg/dL (ref 8.4–10.5)
Creatinine, Ser: 0.6 mg/dL (ref 0.4–1.2)
Glucose, Bld: 98 mg/dL (ref 70–99)
Sodium: 137 mEq/L (ref 135–145)

## 2012-04-09 LAB — CBC WITH DIFFERENTIAL/PLATELET
Basophils Absolute: 0 10*3/uL (ref 0.0–0.1)
Eosinophils Absolute: 0.1 10*3/uL (ref 0.0–0.7)
Hemoglobin: 9 g/dL — ABNORMAL LOW (ref 12.0–15.0)
Lymphocytes Relative: 28.8 % (ref 12.0–46.0)
Lymphs Abs: 1.5 10*3/uL (ref 0.7–4.0)
MCHC: 30.5 g/dL (ref 30.0–36.0)
Neutro Abs: 3.3 10*3/uL (ref 1.4–7.7)
Platelets: 289 10*3/uL (ref 150.0–400.0)
RDW: 19.3 % — ABNORMAL HIGH (ref 11.5–14.6)

## 2012-04-09 LAB — HEPATIC FUNCTION PANEL
Albumin: 3.4 g/dL — ABNORMAL LOW (ref 3.5–5.2)
Alkaline Phosphatase: 70 U/L (ref 39–117)

## 2012-04-09 LAB — LIPID PANEL
Cholesterol: 120 mg/dL (ref 0–200)
HDL: 36.4 mg/dL — ABNORMAL LOW (ref >39.00)
VLDL: 9.6 mg/dL (ref 0.0–40.0)

## 2012-04-09 LAB — URINE CULTURE

## 2012-05-02 ENCOUNTER — Encounter: Payer: Self-pay | Admitting: Family Medicine

## 2012-05-24 ENCOUNTER — Telehealth: Payer: Self-pay | Admitting: Family Medicine

## 2012-05-24 DIAGNOSIS — F988 Other specified behavioral and emotional disorders with onset usually occurring in childhood and adolescence: Secondary | ICD-10-CM

## 2012-05-24 MED ORDER — AMPHETAMINE-DEXTROAMPHETAMINE 20 MG PO TABS: ORAL_TABLET | ORAL | Status: DC

## 2012-05-24 MED ORDER — AMPHETAMINE-DEXTROAMPHET ER 30 MG PO CP24: 30.0000 mg | ORAL_CAPSULE | ORAL | Status: DC

## 2012-05-24 NOTE — Telephone Encounter (Signed)
Patient called requesting rx for adderall 30 mg ER and 20 mg. Call [phone] when ready to pick up.

## 2012-05-24 NOTE — Telephone Encounter (Signed)
Msg left advising Rx ready for pick up.      KP 

## 2012-09-12 ENCOUNTER — Telehealth: Payer: Self-pay | Admitting: General Practice

## 2012-09-12 DIAGNOSIS — K219 Gastro-esophageal reflux disease without esophagitis: Secondary | ICD-10-CM

## 2012-09-12 DIAGNOSIS — F988 Other specified behavioral and emotional disorders with onset usually occurring in childhood and adolescence: Secondary | ICD-10-CM

## 2012-09-12 DIAGNOSIS — E538 Deficiency of other specified B group vitamins: Secondary | ICD-10-CM

## 2012-09-12 MED ORDER — CYANOCOBALAMIN 1000 MCG/ML IJ SOLN: 1000.0000 ug | Freq: Once | INTRAMUSCULAR | Status: DC

## 2012-09-12 MED ORDER — OMEPRAZOLE 40 MG PO CPDR: 40.0000 mg | DELAYED_RELEASE_CAPSULE | Freq: Every day | ORAL | Status: DC

## 2012-09-12 MED ORDER — INTEGRA PLUS PO CAPS: 1.0000 | ORAL_CAPSULE | Freq: Every day | ORAL | Status: DC

## 2012-09-12 MED ORDER — AMPHETAMINE-DEXTROAMPHET ER 30 MG PO CP24: 30.0000 mg | ORAL_CAPSULE | ORAL | Status: DC

## 2012-09-12 MED ORDER — AMPHETAMINE-DEXTROAMPHETAMINE 20 MG PO TABS: ORAL_TABLET | ORAL | Status: DC

## 2012-09-12 NOTE — Telephone Encounter (Signed)
Meds filled. Called pt and notified also scheduled follow up appt.

## 2012-09-12 NOTE — Telephone Encounter (Signed)
Pt called stating she needs refills on:   amphetamine-dextroamphetamine (ADDERALL XR) 30 MG 24 hr capsule  05/24/12 #90 amphetamine-dextroamphetamine (ADDERALL) 20 MG tablet  05/24/12 #90  cyanocobalamin (,VITAMIN B-12,) 1000 MCG/ML injection  04-08-12 1mL with 11 refills FeFum-FePoly-FA-B Cmp-C-Biot (INTEGRA PLUS) CAPS 04-08-12 #30 with 11 refills  Pt was advised she needed to return in one month to recheck her B12 and Iron levels due to severe anemia, labs were due in May. Please advise if ok to fill ?

## 2012-09-12 NOTE — Telephone Encounter (Signed)
Refill x1--- pt due ov next month and labs

## 2012-10-02 ENCOUNTER — Ambulatory Visit: Payer: Managed Care, Other (non HMO) | Admitting: Family Medicine

## 2012-10-08 ENCOUNTER — Ambulatory Visit (INDEPENDENT_AMBULATORY_CARE_PROVIDER_SITE_OTHER): Payer: Managed Care, Other (non HMO) | Admitting: Family Medicine

## 2012-10-08 ENCOUNTER — Encounter: Payer: Self-pay | Admitting: Family Medicine

## 2012-10-08 VITALS — BP 120/78 | HR 74 | Temp 98.5°F | Wt 224.0 lb

## 2012-10-08 DIAGNOSIS — Z9884 Bariatric surgery status: Secondary | ICD-10-CM

## 2012-10-08 DIAGNOSIS — D649 Anemia, unspecified: Secondary | ICD-10-CM

## 2012-10-08 DIAGNOSIS — F411 Generalized anxiety disorder: Secondary | ICD-10-CM

## 2012-10-08 DIAGNOSIS — R739 Hyperglycemia, unspecified: Secondary | ICD-10-CM

## 2012-10-08 DIAGNOSIS — E538 Deficiency of other specified B group vitamins: Secondary | ICD-10-CM

## 2012-10-08 DIAGNOSIS — F409 Phobic anxiety disorder, unspecified: Secondary | ICD-10-CM

## 2012-10-08 DIAGNOSIS — R7309 Other abnormal glucose: Secondary | ICD-10-CM

## 2012-10-08 DIAGNOSIS — F988 Other specified behavioral and emotional disorders with onset usually occurring in childhood and adolescence: Secondary | ICD-10-CM

## 2012-10-08 DIAGNOSIS — F489 Nonpsychotic mental disorder, unspecified: Secondary | ICD-10-CM

## 2012-10-08 DIAGNOSIS — F5105 Insomnia due to other mental disorder: Secondary | ICD-10-CM | POA: Insufficient documentation

## 2012-10-08 DIAGNOSIS — Z23 Encounter for immunization: Secondary | ICD-10-CM

## 2012-10-08 LAB — CBC WITH DIFFERENTIAL/PLATELET
Eosinophils Relative: 2 % (ref 0.0–5.0)
HCT: 33.9 % — ABNORMAL LOW (ref 36.0–46.0)
Hemoglobin: 10.4 g/dL — ABNORMAL LOW (ref 12.0–15.0)
Lymphocytes Relative: 12.3 % (ref 12.0–46.0)
Lymphs Abs: 1.2 10*3/uL (ref 0.7–4.0)
Monocytes Relative: 6.4 % (ref 3.0–12.0)
Platelets: 299 10*3/uL (ref 150.0–400.0)
WBC: 9.9 10*3/uL (ref 4.5–10.5)

## 2012-10-08 LAB — HEPATIC FUNCTION PANEL
ALT: 31 U/L (ref 0–35)
AST: 26 U/L (ref 0–37)
Alkaline Phosphatase: 69 U/L (ref 39–117)
Bilirubin, Direct: 0 mg/dL (ref 0.0–0.3)
Total Bilirubin: 0.4 mg/dL (ref 0.3–1.2)

## 2012-10-08 LAB — LIPID PANEL
Cholesterol: 120 mg/dL (ref 0–200)
HDL: 51.6 mg/dL (ref >39.00)
LDL Cholesterol: 58 mg/dL (ref 0–99)
Total CHOL/HDL Ratio: 2
VLDL: 10.2 mg/dL (ref 0.0–40.0)

## 2012-10-08 LAB — HEMOGLOBIN A1C: Hgb A1c MFr Bld: 6.3 % (ref 4.6–6.5)

## 2012-10-08 LAB — VITAMIN B12: Vitamin B-12: 804 pg/mL (ref 211–911)

## 2012-10-08 LAB — BASIC METABOLIC PANEL
CO2: 28 mEq/L (ref 19–32)
Chloride: 103 mEq/L (ref 96–112)
Creatinine, Ser: 0.6 mg/dL (ref 0.4–1.2)
GFR: 112.99 mL/min (ref >60.00)
Potassium: 4 mEq/L (ref 3.5–5.1)
Sodium: 138 mEq/L (ref 135–145)

## 2012-10-08 LAB — FERRITIN: Ferritin: 2.6 ng/mL — ABNORMAL LOW (ref 10.0–291.0)

## 2012-10-08 LAB — IBC PANEL
Iron: 30 ug/dL — ABNORMAL LOW (ref 42–145)
Saturation Ratios: 6.9 % — ABNORMAL LOW (ref 20.0–50.0)

## 2012-10-08 MED ORDER — AMPHETAMINE-DEXTROAMPHETAMINE 20 MG PO TABS: ORAL_TABLET | ORAL | Status: DC

## 2012-10-08 MED ORDER — INTEGRA PLUS PO CAPS: 1.0000 | ORAL_CAPSULE | Freq: Every day | ORAL | Status: DC

## 2012-10-08 MED ORDER — ALPRAZOLAM 0.5 MG PO TABS: 0.5000 mg | ORAL_TABLET | Freq: Every evening | ORAL | Status: DC | PRN

## 2012-10-08 MED ORDER — CYANOCOBALAMIN 1000 MCG/ML IJ SOLN: 1000.0000 ug | Freq: Once | INTRAMUSCULAR | Status: DC

## 2012-10-08 MED ORDER — AMPHETAMINE-DEXTROAMPHET ER 30 MG PO CP24: 30.0000 mg | ORAL_CAPSULE | ORAL | Status: DC

## 2012-10-08 NOTE — Progress Notes (Signed)
  Subjective:    Patient ID: Erin Howe, female    DOB: 31-Mar-1970, 42 y.o.   MRN: [id number]  HPI Pt here to f/u anemia and get labs.  Pt is under a lot of stress and is not sleeping so she is exhausted.     Review of Systems    as above Objective:   Physical Exam BP 120/78  Pulse 74  Temp(Src) 98.5 F (36.9 C) (Oral)  Wt 224 lb (101.606 kg)  BMI 37.28 kg/m2  SpO2 97% General appearance: alert, cooperative, appears stated age and no distress Lungs: clear to auscultation bilaterally Heart: S1, S2 normal        Assessment & Plan:

## 2012-10-08 NOTE — Patient Instructions (Addendum)

## 2012-10-08 NOTE — Assessment & Plan Note (Signed)
Xanax 0.25 mg 1 po tid prn

## 2012-10-08 NOTE — Assessment & Plan Note (Signed)
Check labs 

## 2012-10-08 NOTE — Assessment & Plan Note (Signed)
Check labs con't meds 

## 2012-11-07 ENCOUNTER — Other Ambulatory Visit: Payer: Self-pay

## 2012-11-12 ENCOUNTER — Encounter: Payer: Self-pay | Admitting: Family Medicine

## 2012-11-27 ENCOUNTER — Encounter: Payer: Self-pay | Admitting: Family Medicine

## 2012-11-27 ENCOUNTER — Ambulatory Visit (INDEPENDENT_AMBULATORY_CARE_PROVIDER_SITE_OTHER): Payer: Managed Care, Other (non HMO) | Admitting: Family Medicine

## 2012-11-27 ENCOUNTER — Ambulatory Visit (INDEPENDENT_AMBULATORY_CARE_PROVIDER_SITE_OTHER)
Admission: RE | Admit: 2012-11-27 | Discharge: 2012-11-27 | Disposition: A | Discharge status: Home / Self Care | Payer: Managed Care, Other (non HMO) | Source: Ambulatory Visit | Attending: Family Medicine | Admitting: Family Medicine

## 2012-11-27 VITALS — BP 129/82 | HR 74 | Resp 16 | Wt 224.0 lb

## 2012-11-27 DIAGNOSIS — M549 Dorsalgia, unspecified: Secondary | ICD-10-CM

## 2012-11-27 DIAGNOSIS — F411 Generalized anxiety disorder: Secondary | ICD-10-CM

## 2012-11-27 MED ORDER — ALPRAZOLAM 0.5 MG PO TABS: 0.5000 mg | ORAL_TABLET | Freq: Every evening | ORAL | Status: DC | PRN

## 2012-11-27 MED ORDER — HYDROCODONE-ACETAMINOPHEN 5-325 MG PO TABS: 1.0000 | ORAL_TABLET | Freq: Four times a day (QID) | ORAL | Status: DC | PRN

## 2012-11-27 MED ORDER — CYCLOBENZAPRINE HCL 10 MG PO TABS: 10.0000 mg | ORAL_TABLET | Freq: Three times a day (TID) | ORAL | Status: DC | PRN

## 2012-11-27 NOTE — Patient Instructions (Signed)
Back Pain, Adult Low back pain is very common. About 1 in 5 people have back pain.The cause of low back pain is rarely dangerous. The pain often gets better over time.About half of people with a sudden onset of back pain feel better in just 2 weeks. About 8 in 10 people feel better by 6 weeks.  CAUSES Some common causes of back pain include:  Strain of the muscles or ligaments supporting the spine.  Wear and tear (degeneration) of the spinal discs.  Arthritis.  Direct injury to the back. DIAGNOSIS Most of the time, the direct cause of low back pain is not known.However, back pain can be treated effectively even when the exact cause of the pain is unknown.Answering your caregiver's questions about your overall health and symptoms is one of the most accurate ways to make sure the cause of your pain is not dangerous. If your caregiver needs more information, he or she may order lab work or imaging tests (X-rays or MRIs).However, even if imaging tests show changes in your back, this usually does not require surgery. HOME CARE INSTRUCTIONS For many people, back pain returns.Since low back pain is rarely dangerous, it is often a condition that people can learn to manageon their own.   Remain active. It is stressful on the back to sit or stand in one place. Do not sit, drive, or stand in one place for more than 30 minutes at a time. Take short walks on level surfaces as soon as pain allows.Try to increase the length of time you walk each day.  Do not stay in bed.Resting more than 1 or 2 days can delay your recovery.  Do not avoid exercise or work.Your body is made to move.It is not dangerous to be active, even though your back may hurt.Your back will likely heal faster if you return to being active before your pain is gone.  Pay attention to your body when you bend and lift. Many people have less discomfortwhen lifting if they bend their knees, keep the load close to their bodies,and  avoid twisting. Often, the most comfortable positions are those that put less stress on your recovering back.  Find a comfortable position to sleep. Use a firm mattress and lie on your side with your knees slightly bent. If you lie on your back, put a pillow under your knees.  Only take over-the-counter or prescription medicines as directed by your caregiver. Over-the-counter medicines to reduce pain and inflammation are often the most helpful.Your caregiver may prescribe muscle relaxant drugs.These medicines help dull your pain so you can more quickly return to your normal activities and healthy exercise.  Put ice on the injured area.  Put ice in a plastic bag.  Place a towel between your skin and the bag.  Leave the ice on for 15-20 minutes, 03-04 times a day for the first 2 to 3 days. After that, ice and heat may be alternated to reduce pain and spasms.  Ask your caregiver about trying back exercises and gentle massage. This may be of some benefit.  Avoid feeling anxious or stressed.Stress increases muscle tension and can worsen back pain.It is important to recognize when you are anxious or stressed and learn ways to manage it.Exercise is a great option. SEEK MEDICAL CARE IF:  You have pain that is not relieved with rest or medicine.  You have pain that does not improve in 1 week.  You have new symptoms.  You are generally not feeling well. SEEK   IMMEDIATE MEDICAL CARE IF:   You have pain that radiates from your back into your legs.  You develop new bowel or bladder control problems.  You have unusual weakness or numbness in your arms or legs.  You develop nausea or vomiting.  You develop abdominal pain.  You feel faint. Document Released: 12/19/2004 Document Revised: 06/20/2011 Document Reviewed: 05/09/2010 Exit[employer] Patient Information 2014 [employer].  

## 2012-11-27 NOTE — Progress Notes (Signed)
Pre visit review using our clinic review tool, if applicable. No additional management support is needed unless otherwise documented below in the visit note. 

## 2012-11-27 NOTE — Progress Notes (Signed)
  Subjective:    Erin Howe is a 42 y.o. female who presents for evaluation of low back pain. The patient has had no prior back problems. Symptoms have been present for 2 days and are gradually worsening.  Onset was related to / precipitated by a fall. The pain is located in the right lumbar area and does not radiate. The pain is described as sharp and stiffness and occurs all day. She rates her pain as severe. Symptoms are exacerbated by lifting, sitting, standing and walking. Symptoms are improved by rest. She has also tried nothing which provided no symptom relief. She has no other symptoms associated with the back pain. The patient has no "red flag" history indicative of complicated back pain.  The following portions of the patient's history were reviewed and updated as appropriate: allergies, current medications, past family history, past medical history, past social history, past surgical history and problem list.  Review of Systems Pertinent items are noted in HPI.    Objective:   Inspection and palpation: antalgic gait. Muscle tone and ROM exam: muscle spasm noted r low back, limited range of motion with pain, antalgic gait. Straight leg raise: positive at 45 degrees on the right. Neurological: normal DTRs, muscle strength and reflexes.    Assessment:    Nonspecific acute low back pain    Plan:    Educational material distributed. Proper lifting, bending technique discussed. Short (2-4 day) period of relative rest recommended until acute symptoms improve. Ice to affected area as needed for local pain relief. Heat to affected area as needed for local pain relief. Muscle relaxants per medication orders. xray ordered

## 2012-12-31 ENCOUNTER — Telehealth: Payer: Self-pay | Admitting: Family Medicine

## 2012-12-31 DIAGNOSIS — F988 Other specified behavioral and emotional disorders with onset usually occurring in childhood and adolescence: Secondary | ICD-10-CM

## 2012-12-31 NOTE — Telephone Encounter (Signed)
To MD for review     KP 

## 2012-12-31 NOTE — Telephone Encounter (Signed)
Ok to reprint but remind me to check data base

## 2012-12-31 NOTE — Telephone Encounter (Signed)
Patient states that she came to pick up her Adderall rx on 12/25/12 and put in her glove box in her car. On 12/27/12 when she looked for them to take to the pharmacy both printed rx's were gone. She says she has filed a police report but meanwhile needs rx's for Adderall re-written. Please advise.

## 2013-01-01 MED ORDER — AMPHETAMINE-DEXTROAMPHETAMINE 20 MG PO TABS: ORAL_TABLET | ORAL | Status: DC

## 2013-01-01 MED ORDER — AMPHETAMINE-DEXTROAMPHET ER 30 MG PO CP24: 30.0000 mg | ORAL_CAPSULE | ORAL | Status: DC

## 2013-01-01 NOTE — Telephone Encounter (Signed)
The pharmacy called the police for the patient and advised the patient to call here, they have nothing to do with whether the insurance company will fill it or not. Please advise     KP

## 2013-01-01 NOTE — Telephone Encounter (Signed)
Data base adderall was filled 12/25/12

## 2013-01-01 NOTE — Telephone Encounter (Signed)
Check database and send this back to me. Thanks       KP

## 2013-01-01 NOTE — Telephone Encounter (Signed)
Has she talked with pharmacy?  I'm not sure they will fill it until the end of January----they may with police report.  If she has ok to reprint

## 2013-01-01 NOTE — Telephone Encounter (Signed)
VM left advising Rx ready for pick up on friday.      KP

## 2013-01-01 NOTE — Telephone Encounter (Signed)
Spoke with patient and she stated both her bottle's of Adderall, She stated she has a police report. Please advise       KP

## 2013-01-01 NOTE — Telephone Encounter (Signed)
Ok to refill x 1  

## 2013-01-28 ENCOUNTER — Ambulatory Visit: Payer: Managed Care, Other (non HMO) | Admitting: Family Medicine

## 2013-01-28 DIAGNOSIS — Z0289 Encounter for other administrative examinations: Secondary | ICD-10-CM

## 2013-01-29 ENCOUNTER — Other Ambulatory Visit: Payer: Self-pay | Admitting: *Deleted

## 2013-01-29 ENCOUNTER — Telehealth: Payer: Self-pay | Admitting: *Deleted

## 2013-01-29 DIAGNOSIS — E538 Deficiency of other specified B group vitamins: Secondary | ICD-10-CM

## 2013-01-29 DIAGNOSIS — D649 Anemia, unspecified: Secondary | ICD-10-CM

## 2013-01-29 DIAGNOSIS — F988 Other specified behavioral and emotional disorders with onset usually occurring in childhood and adolescence: Secondary | ICD-10-CM

## 2013-01-29 DIAGNOSIS — F411 Generalized anxiety disorder: Secondary | ICD-10-CM

## 2013-01-29 MED ORDER — CYANOCOBALAMIN 1000 MCG/ML IJ SOLN: 1000.0000 ug | Freq: Once | INTRAMUSCULAR | Status: DC

## 2013-01-29 MED ORDER — INTEGRA PLUS PO CAPS
1.0000 | ORAL_CAPSULE | Freq: Every day | ORAL | Status: DC
Start: 2013-01-29 — End: 2013-04-09

## 2013-01-29 NOTE — Telephone Encounter (Signed)
ALPRAZolam (XANAX) 0.5 MG tablet 11/27/12 #90, 1 refill  amphetamine-dextroamphetamine (ADDERALL XR) 30 MG 24 hr capsule 01/01/13 #90, 0 refills  amphetamine-dextroamphetamine (ADDERALL) 20 MG tablet 01/01/13 #90, 0 refill  Last OV: 11/27/12 UDS up-to-date, low risk

## 2013-01-29 NOTE — Telephone Encounter (Signed)
Ok to refll adderall 3 months and other x1  Pt recently missed ov didn't she

## 2013-01-30 MED ORDER — AMPHETAMINE-DEXTROAMPHET ER 30 MG PO CP24
30.0000 mg | ORAL_CAPSULE | ORAL | Status: DC
Start: 2013-01-30 — End: 2013-04-09

## 2013-01-30 MED ORDER — AMPHETAMINE-DEXTROAMPHETAMINE 20 MG PO TABS: ORAL_TABLET | ORAL | Status: DC

## 2013-01-30 MED ORDER — ALPRAZOLAM 0.5 MG PO TABS: 0.5000 mg | ORAL_TABLET | Freq: Every evening | ORAL | Status: DC | PRN

## 2013-01-30 NOTE — Telephone Encounter (Signed)
MSG left advising Rx ready for pick up.     KP 

## 2013-02-07 ENCOUNTER — Telehealth: Payer: Self-pay | Admitting: *Deleted

## 2013-02-07 NOTE — Telephone Encounter (Signed)
02/07/2013 Pt husband dropped off Medical Eval form from [facility].  Attached billing form, made copy and put in folder at front desk.  Put in Sandra's folder.  bw

## 2013-02-10 DIAGNOSIS — Z0279 Encounter for issue of other medical certificate: Secondary | ICD-10-CM

## 2013-02-10 NOTE — Telephone Encounter (Signed)
Forms competed and placed in Dr. Ernst SpellLowne's blue folder for her signature.

## 2013-02-11 NOTE — Telephone Encounter (Signed)
Received completed forms. Copies made for my folder, billing, batch and patient. Billing copy given to [place]. Called and left message for patient. JG//CMA

## 2013-02-13 ENCOUNTER — Telehealth: Payer: Self-pay | Admitting: *Deleted

## 2013-02-13 NOTE — Telephone Encounter (Signed)
02/13/2013 Copy sent to billing.  bw 

## 2013-04-08 ENCOUNTER — Telehealth: Payer: Self-pay | Admitting: Family Medicine

## 2013-04-08 DIAGNOSIS — D649 Anemia, unspecified: Secondary | ICD-10-CM

## 2013-04-08 DIAGNOSIS — F411 Generalized anxiety disorder: Secondary | ICD-10-CM

## 2013-04-08 DIAGNOSIS — M549 Dorsalgia, unspecified: Secondary | ICD-10-CM

## 2013-04-08 DIAGNOSIS — F988 Other specified behavioral and emotional disorders with onset usually occurring in childhood and adolescence: Secondary | ICD-10-CM

## 2013-04-08 DIAGNOSIS — E538 Deficiency of other specified B group vitamins: Secondary | ICD-10-CM

## 2013-04-08 NOTE — Telephone Encounter (Signed)
Patient called to request refills for Xanax, Hydrocodone, Vitamin B-12, Integra and Adderall 30 mg and 20 mg. Also patient stated if Dr Laury AxonLowne can up the dosage on her Xanaxn to 1 mg to help her sleep better. Please advise.

## 2013-04-08 NOTE — Telephone Encounter (Signed)
Ok to refill all x1 but I do not feel comfortable inc xanax to 1 mg--- she can come in and we can discuss other options or sleep specialist

## 2013-04-08 NOTE — Telephone Encounter (Signed)
The patient was last seen 11/27/12 and filled 01/30/13. Please advise      KP

## 2013-04-09 MED ORDER — ALPRAZOLAM 0.5 MG PO TABS: 0.5000 mg | ORAL_TABLET | Freq: Every evening | ORAL | Status: DC | PRN

## 2013-04-09 MED ORDER — AMPHETAMINE-DEXTROAMPHET ER 30 MG PO CP24: 30.0000 mg | ORAL_CAPSULE | ORAL | Status: DC

## 2013-04-09 MED ORDER — CYANOCOBALAMIN 1000 MCG/ML IJ SOLN: 1000.0000 ug | Freq: Once | INTRAMUSCULAR | Status: DC

## 2013-04-09 MED ORDER — AMPHETAMINE-DEXTROAMPHETAMINE 20 MG PO TABS
ORAL_TABLET | ORAL | Status: DC
Start: 2013-04-09 — End: 2013-08-11

## 2013-04-09 MED ORDER — HYDROCODONE-ACETAMINOPHEN 5-325 MG PO TABS: 1.0000 | ORAL_TABLET | Freq: Four times a day (QID) | ORAL | Status: DC | PRN

## 2013-04-09 MED ORDER — INTEGRA PLUS PO CAPS: 1.0000 | ORAL_CAPSULE | Freq: Every day | ORAL | Status: DC

## 2013-04-09 NOTE — Telephone Encounter (Signed)
Left detailed message that Rx was ready to be picked up. Also stated that Dr Laury AxonLowne was not comfortable increasing the Xanax, advised pt that she could come in to discuss other options or be referred to a sleep specialist

## 2013-04-09 NOTE — Telephone Encounter (Signed)
Refills for Xanax, Adderall, Hydrocodone, Vit B-12 and Integra done.

## 2013-04-09 NOTE — Telephone Encounter (Signed)
Left message for pt to return my call.

## 2013-04-15 ENCOUNTER — Other Ambulatory Visit: Payer: Self-pay | Admitting: Family Medicine

## 2013-05-22 ENCOUNTER — Other Ambulatory Visit: Payer: Self-pay | Admitting: Family Medicine

## 2013-08-01 ENCOUNTER — Telehealth: Payer: Self-pay | Admitting: Family Medicine

## 2013-08-01 MED ORDER — CYCLOBENZAPRINE HCL 10 MG PO TABS: ORAL_TABLET | ORAL | Status: DC

## 2013-08-01 MED ORDER — INTEGRA PLUS PO CAPS: 1.0000 | ORAL_CAPSULE | Freq: Every day | ORAL | Status: DC

## 2013-08-01 MED ORDER — OMEPRAZOLE 40 MG PO CPDR: 40.0000 mg | DELAYED_RELEASE_CAPSULE | Freq: Every day | ORAL | Status: DC

## 2013-08-01 NOTE — Telephone Encounter (Signed)
Caller name: Randa EvensJoanne Relation to pt: Call back number:[phone] Pharmacy: [phone] [place]  Reason for call: pt need 90 day re-fills on the following: amphetamine-dextroamphetamine (ADDERALL) 20 MG tablet HYDROcodone-acetaminophen (NORCO/VICODIN) 5-325 MG per tablet omeprazole (PRILOSEC) 40 MG capsule amphetamine-dextroamphetamine (ADDERALL XR) 30 MG 24 hr capsule cyclobenzaprine (FLEXERIL) 10 MG tablet ALPRAZolam (XANAX) 0.5 MG tablet FeFum-FePoly-FA-B Cmp-C-Biot (INTEGRA PLUS) CAPS

## 2013-08-01 NOTE — Telephone Encounter (Signed)
Patient has been made aware she will need a follow up for refills on controlled medications and since she has not been here in 8 mos she will need an OV, she voiced understanding and agreed. Apt scheduled for 08/11/13 at 8:15, I also scheduled her CPE and her Husbands CPE.    KP

## 2013-08-11 ENCOUNTER — Encounter: Payer: Self-pay | Admitting: Family Medicine

## 2013-08-11 ENCOUNTER — Ambulatory Visit (INDEPENDENT_AMBULATORY_CARE_PROVIDER_SITE_OTHER): Payer: Managed Care, Other (non HMO) | Admitting: Family Medicine

## 2013-08-11 VITALS — BP 142/80 | HR 85 | Temp 98.1°F | Wt 229.2 lb

## 2013-08-11 DIAGNOSIS — F988 Other specified behavioral and emotional disorders with onset usually occurring in childhood and adolescence: Secondary | ICD-10-CM

## 2013-08-11 DIAGNOSIS — M545 Low back pain, unspecified: Secondary | ICD-10-CM

## 2013-08-11 DIAGNOSIS — H9190 Unspecified hearing loss, unspecified ear: Secondary | ICD-10-CM

## 2013-08-11 DIAGNOSIS — E538 Deficiency of other specified B group vitamins: Secondary | ICD-10-CM

## 2013-08-11 DIAGNOSIS — F411 Generalized anxiety disorder: Secondary | ICD-10-CM

## 2013-08-11 DIAGNOSIS — H9193 Unspecified hearing loss, bilateral: Secondary | ICD-10-CM

## 2013-08-11 MED ORDER — AMPHETAMINE-DEXTROAMPHET ER 30 MG PO CP24: 30.0000 mg | ORAL_CAPSULE | ORAL | Status: DC

## 2013-08-11 MED ORDER — ALPRAZOLAM 0.5 MG PO TABS: 0.5000 mg | ORAL_TABLET | Freq: Three times a day (TID) | ORAL | Status: DC | PRN

## 2013-08-11 MED ORDER — HYDROCODONE-ACETAMINOPHEN 5-325 MG PO TABS: 1.0000 | ORAL_TABLET | Freq: Four times a day (QID) | ORAL | Status: DC | PRN

## 2013-08-11 MED ORDER — CYANOCOBALAMIN 1000 MCG/ML IJ SOLN: 1000.0000 ug | Freq: Once | INTRAMUSCULAR | Status: DC

## 2013-08-11 MED ORDER — AMPHETAMINE-DEXTROAMPHETAMINE 20 MG PO TABS: ORAL_TABLET | ORAL | Status: DC

## 2013-08-11 MED ORDER — CYCLOBENZAPRINE HCL 10 MG PO TABS: ORAL_TABLET | ORAL | Status: DC

## 2013-08-11 NOTE — Progress Notes (Signed)
Pre visit review using our clinic review tool, if applicable. No additional management support is needed unless otherwise documented below in the visit note. 

## 2013-08-11 NOTE — Progress Notes (Signed)
   Subjective:    Patient ID: Erin NobleJoanne S Antony, female    DOB: 10/27/1970, 43 y.o.   MRN: [id number]  HPI Pt is here to f/u add but also c/o increasing anxiety/ stress since her husband has left.  Since being on meds from psych he has gained weight and has been blunted.      Review of Systems As above    Objective:   Physical Exam  BP 142/80  Pulse 85  Temp(Src) 98.1 F (36.7 C) (Oral)  Wt 229 lb 3.2 oz (103.964 kg)  SpO2 93% General appearance: alert, cooperative, appears stated age and no distress HEENT-- ears clear, normal TM Neck: no adenopathy, supple, symmetrical, trachea midline and thyroid not enlarged, symmetric, no tenderness/mass/nodules Lungs: clear to auscultation bilaterally Heart: S1, S2 normal Extremities: extremities normal, atraumatic, no cyanosis or edema Neurologic: Alert and oriented X 3, normal strength and tone. Normal symmetric reflexes. Normal coordination and gait      Assessment & Plan:  1. ADD (attention deficit disorder) Stable, con't meds - amphetamine-dextroamphetamine (ADDERALL) 20 MG tablet; 1 po q afternoon  Dispense: 90 tablet; Refill: 0 - amphetamine-dextroamphetamine (ADDERALL XR) 30 MG 24 hr capsule; Take 1 capsule (30 mg total) by mouth every morning.  Dispense: 90 capsule; Refill: 0  2. Anxiety state, unspecified Worsening secondary to husband leaving.--- names for counselors given - ALPRAZolam (XANAX) 0.5 MG tablet; Take 1 tablet (0.5 mg total) by mouth 3 (three) times daily as needed for anxiety or sleep.  Dispense: 90 tablet; Refill: 1  3. Bilateral low back pain without sciatica Stable-- only occassional acting up - HYDROcodone-acetaminophen (NORCO/VICODIN) 5-325 MG per tablet; Take 1 tablet by mouth every 6 (six) hours as needed for moderate pain.  Dispense: 30 tablet; Refill: 0  4. Hearing loss, bilateral  - Ambulatory referral to ENT  5. B12 deficiency  - cyanocobalamin (,VITAMIN B-12,) 1000 MCG/ML injection; Inject 1 mL  (1,000 mcg total) into the skin once. Weekly x4 then 1ml  Dispense: 1 mL; Refill: 11

## 2013-08-11 NOTE — Patient Instructions (Signed)

## 2013-08-26 ENCOUNTER — Ambulatory Visit: Payer: Self-pay | Admitting: Unknown Physician Specialty

## 2013-10-09 ENCOUNTER — Encounter: Payer: Managed Care, Other (non HMO) | Admitting: Family Medicine

## 2013-10-10 ENCOUNTER — Encounter: Payer: Managed Care, Other (non HMO) | Admitting: Family Medicine

## 2013-11-03 ENCOUNTER — Ambulatory Visit (INDEPENDENT_AMBULATORY_CARE_PROVIDER_SITE_OTHER): Payer: Managed Care, Other (non HMO) | Admitting: Family Medicine

## 2013-11-03 ENCOUNTER — Encounter: Payer: Self-pay | Admitting: Family Medicine

## 2013-11-03 VITALS — BP 114/72 | HR 59 | Temp 98.0°F | Ht 66.0 in | Wt 231.2 lb

## 2013-11-03 DIAGNOSIS — Z0000 Encounter for general adult medical examination without abnormal findings: Secondary | ICD-10-CM

## 2013-11-03 DIAGNOSIS — Z23 Encounter for immunization: Secondary | ICD-10-CM

## 2013-11-03 DIAGNOSIS — E538 Deficiency of other specified B group vitamins: Secondary | ICD-10-CM

## 2013-11-03 DIAGNOSIS — F988 Other specified behavioral and emotional disorders with onset usually occurring in childhood and adolescence: Secondary | ICD-10-CM

## 2013-11-03 DIAGNOSIS — D509 Iron deficiency anemia, unspecified: Secondary | ICD-10-CM

## 2013-11-03 DIAGNOSIS — K909 Intestinal malabsorption, unspecified: Secondary | ICD-10-CM | POA: Diagnosis not present

## 2013-11-03 DIAGNOSIS — F909 Attention-deficit hyperactivity disorder, unspecified type: Secondary | ICD-10-CM

## 2013-11-03 DIAGNOSIS — F411 Generalized anxiety disorder: Secondary | ICD-10-CM

## 2013-11-03 LAB — BASIC METABOLIC PANEL
BUN: 9 mg/dL (ref 6–23)
CALCIUM: 8.8 mg/dL (ref 8.4–10.5)
CO2: 28 mEq/L (ref 19–32)
Chloride: 104 mEq/L (ref 96–112)
Creatinine, Ser: 0.6 mg/dL (ref 0.4–1.2)
GFR: 106.38 mL/min (ref >60.00)
GLUCOSE: 89 mg/dL (ref 70–99)
POTASSIUM: 4.2 meq/L (ref 3.5–5.1)
SODIUM: 137 meq/L (ref 135–145)

## 2013-11-03 LAB — LIPID PANEL
Cholesterol: 104 mg/dL (ref 0–200)
HDL: 37.4 mg/dL — ABNORMAL LOW (ref >39.00)
LDL Cholesterol: 58 mg/dL (ref 0–99)
NonHDL: 66.6
Total CHOL/HDL Ratio: 3
Triglycerides: 44 mg/dL (ref 0.0–149.0)
VLDL: 8.8 mg/dL (ref 0.0–40.0)

## 2013-11-03 LAB — HEPATIC FUNCTION PANEL
ALBUMIN: 3 g/dL — AB (ref 3.5–5.2)
ALT: 21 U/L (ref 0–35)
AST: 21 U/L (ref 0–37)
Alkaline Phosphatase: 68 U/L (ref 39–117)
BILIRUBIN TOTAL: 0.4 mg/dL (ref 0.2–1.2)
Bilirubin, Direct: 0.1 mg/dL (ref 0.0–0.3)
Total Protein: 6.4 g/dL (ref 6.0–8.3)

## 2013-11-03 LAB — CBC WITH DIFFERENTIAL/PLATELET
BASOS ABS: 0 10*3/uL (ref 0.0–0.1)
Basophils Relative: 0.7 % (ref 0.0–3.0)
EOS PCT: 1.7 % (ref 0.0–5.0)
Eosinophils Absolute: 0.1 10*3/uL (ref 0.0–0.7)
HEMATOCRIT: 31.7 % — AB (ref 36.0–46.0)
HEMOGLOBIN: 9.6 g/dL — AB (ref 12.0–15.0)
LYMPHS PCT: 22.1 % (ref 12.0–46.0)
Lymphs Abs: 1.4 10*3/uL (ref 0.7–4.0)
MCHC: 30.2 g/dL (ref 30.0–36.0)
MCV: 64.5 fl — ABNORMAL LOW (ref 78.0–100.0)
MONOS PCT: 7.2 % (ref 3.0–12.0)
Monocytes Absolute: 0.4 10*3/uL (ref 0.1–1.0)
NEUTROS ABS: 4.2 10*3/uL (ref 1.4–7.7)
Neutrophils Relative %: 68.3 % (ref 43.0–77.0)
Platelets: 263 10*3/uL (ref 150.0–400.0)
RBC: 4.91 Mil/uL (ref 3.87–5.11)
RDW: 19.1 % — ABNORMAL HIGH (ref 11.5–15.5)
WBC: 6.2 10*3/uL (ref 4.0–10.5)

## 2013-11-03 LAB — POCT URINALYSIS DIPSTICK
Blood, UA: NEGATIVE
Glucose, UA: NEGATIVE
Ketones, UA: NEGATIVE
Leukocytes, UA: NEGATIVE
Nitrite, UA: NEGATIVE
Urobilinogen, UA: 1
pH, UA: 6

## 2013-11-03 LAB — IBC PANEL
Iron: 13 ug/dL — ABNORMAL LOW (ref 42–145)
Saturation Ratios: 3.4 % — ABNORMAL LOW (ref 20.0–50.0)
TRANSFERRIN: 274.4 mg/dL (ref 212.0–360.0)

## 2013-11-03 LAB — VITAMIN B12: VITAMIN B 12: 872 pg/mL (ref 211–911)

## 2013-11-03 LAB — TSH: TSH: 1.62 u[IU]/mL (ref 0.35–4.50)

## 2013-11-03 MED ORDER — AMPHETAMINE-DEXTROAMPHETAMINE 20 MG PO TABS: ORAL_TABLET | ORAL | Status: DC

## 2013-11-03 MED ORDER — CYANOCOBALAMIN 1000 MCG/ML IJ SOLN: 1000.0000 ug | Freq: Once | INTRAMUSCULAR | Status: DC

## 2013-11-03 MED ORDER — AMPHETAMINE-DEXTROAMPHET ER 30 MG PO CP24: 30.0000 mg | ORAL_CAPSULE | ORAL | Status: DC

## 2013-11-03 MED ORDER — INTEGRA PLUS PO CAPS: 1.0000 | ORAL_CAPSULE | Freq: Every day | ORAL | Status: DC

## 2013-11-03 MED ORDER — ALPRAZOLAM 0.5 MG PO TABS
0.5000 mg | ORAL_TABLET | Freq: Three times a day (TID) | ORAL | Status: DC | PRN
Start: 2013-11-03 — End: 2014-01-16

## 2013-11-03 NOTE — Progress Notes (Signed)
Pre visit review using our clinic review tool, if applicable. No additional management support is needed unless otherwise documented below in the visit note. 

## 2013-11-03 NOTE — Patient Instructions (Signed)

## 2013-11-03 NOTE — Progress Notes (Signed)
   Subjective:    Patient ID: Erin Howe, female    DOB: 05/20/1970, 43 y.o.   MRN: [id number]  HPI Pt here to have labs done for Ins and get refill on meds.  No complaints.   Review of Systems As above    Objective:   Physical Exam  BP 114/72 mmHg  Pulse 59  Temp(Src) 98 F (36.7 C) (Oral)  Ht 5\' 6"  (1.676 m)  Wt 231 lb 3.2 oz (104.872 kg)  BMI 37.33 kg/m2  SpO2 97%  LMP 10/03/2013 General appearance: alert, cooperative, appears stated age and no distress Throat: lips, mucosa, and tongue normal; teeth and gums normal Neck: no adenopathy, no carotid bruit, no JVD, supple, symmetrical, trachea midline and thyroid not enlarged, symmetric, no tenderness/mass/nodules Lungs: clear to auscultation bilaterally Heart: S1, S2 normal Extremities: extremities normal, atraumatic, no cyanosis or edema       Assessment & Plan:  1. ADD (attention deficit disorder) Refill meds - amphetamine-dextroamphetamine (ADDERALL XR) 30 MG 24 hr capsule; Take 1 capsule (30 mg total) by mouth every morning.  Dispense: 90 capsule; Refill: 0 - amphetamine-dextroamphetamine (ADDERALL) 20 MG tablet; 1 po q afternoon  Dispense: 90 tablet; Refill: 0  2. Generalized anxiety disorder Stable- refill meds - ALPRAZolam (XANAX) 0.5 MG tablet; Take 1 tablet (0.5 mg total) by mouth 3 (three) times daily as needed for anxiety or sleep.  Dispense: 90 tablet; Refill: 1  3. B12 deficiency Check labs - cyanocobalamin (,VITAMIN B-12,) 1000 MCG/ML injection; Inject 1 mL (1,000 mcg total) into the skin once. Weekly x4 then 1ml  Dispense: 1 mL; Refill: 11 - Vitamin B12  4. Iron deficiency anemia  - FeFum-FePoly-FA-B Cmp-C-Biot (INTEGRA PLUS) CAPS; Take 1 capsule by mouth daily.  Dispense: 90 capsule; Refill: 3 - CBC with Differential - IBC panel - Ferritin; Future  5. Malabsorption Check labs - Basic metabolic panel - CBC with Differential - Hepatic function panel - Lipid panel - Vitamin B12 - TSH -  POCT urinalysis dipstick - Vitamin D 1,25 dihydroxy  6. Preventative health care rto for cpe - Basic metabolic panel - CBC with Differential - Hepatic function panel - Lipid panel - Vitamin B12 - TSH - POCT urinalysis dipstick - Vitamin D 1,25 dihydroxy  7. Need for prophylactic vaccination and inoculation against influenza  - Flu Vaccine QUAD 36+ mos PF IM (Fluarix Quad PF)

## 2013-11-04 ENCOUNTER — Other Ambulatory Visit: Payer: Self-pay | Admitting: Family Medicine

## 2013-11-04 DIAGNOSIS — M545 Low back pain, unspecified: Secondary | ICD-10-CM

## 2013-11-05 NOTE — Telephone Encounter (Signed)
Last seen 11/03/13 and filled 08/11/13 #30 UDS 10/08/12  Please advise     KP

## 2013-11-06 LAB — VITAMIN D 1,25 DIHYDROXY
Vitamin D 1, 25 (OH)2 Total: 51 pg/mL (ref 18–72)
Vitamin D2 1, 25 (OH)2: <8 pg/mL
Vitamin D3 1, 25 (OH)2: 51 pg/mL

## 2013-11-06 MED ORDER — HYDROCODONE-ACETAMINOPHEN 5-325 MG PO TABS: 1.0000 | ORAL_TABLET | Freq: Four times a day (QID) | ORAL | Status: DC | PRN

## 2013-11-06 MED ORDER — CYCLOBENZAPRINE HCL 10 MG PO TABS: ORAL_TABLET | ORAL | Status: DC

## 2013-11-11 NOTE — Telephone Encounter (Signed)
error 

## 2013-11-18 ENCOUNTER — Encounter: Payer: Managed Care, Other (non HMO) | Admitting: Family Medicine

## 2014-01-16 ENCOUNTER — Other Ambulatory Visit: Payer: Self-pay | Admitting: Family Medicine

## 2014-01-16 DIAGNOSIS — M545 Low back pain, unspecified: Secondary | ICD-10-CM

## 2014-01-16 DIAGNOSIS — E538 Deficiency of other specified B group vitamins: Secondary | ICD-10-CM

## 2014-01-16 DIAGNOSIS — F411 Generalized anxiety disorder: Secondary | ICD-10-CM

## 2014-01-16 DIAGNOSIS — F988 Other specified behavioral and emotional disorders with onset usually occurring in childhood and adolescence: Secondary | ICD-10-CM

## 2014-01-16 DIAGNOSIS — D509 Iron deficiency anemia, unspecified: Secondary | ICD-10-CM

## 2014-01-16 MED ORDER — ALPRAZOLAM 0.5 MG PO TABS: 0.5000 mg | ORAL_TABLET | Freq: Three times a day (TID) | ORAL | Status: DC | PRN

## 2014-01-16 MED ORDER — AMPHETAMINE-DEXTROAMPHET ER 30 MG PO CP24: 30.0000 mg | ORAL_CAPSULE | ORAL | Status: DC

## 2014-01-16 MED ORDER — CYANOCOBALAMIN 1000 MCG/ML IJ SOLN: 1000.0000 ug | INTRAMUSCULAR | Status: DC

## 2014-01-16 MED ORDER — INTEGRA PLUS PO CAPS
1.0000 | ORAL_CAPSULE | Freq: Every day | ORAL | Status: DC
Start: 2014-01-16 — End: 2016-05-16

## 2014-01-16 MED ORDER — HYDROCODONE-ACETAMINOPHEN 5-325 MG PO TABS: 1.0000 | ORAL_TABLET | Freq: Four times a day (QID) | ORAL | Status: DC | PRN

## 2014-01-16 MED ORDER — OMEPRAZOLE 40 MG PO CPDR: 40.0000 mg | DELAYED_RELEASE_CAPSULE | Freq: Every day | ORAL | Status: AC

## 2014-01-16 MED ORDER — AMPHETAMINE-DEXTROAMPHETAMINE 20 MG PO TABS: ORAL_TABLET | ORAL | Status: DC

## 2014-01-16 MED ORDER — CYCLOBENZAPRINE HCL 10 MG PO TABS: ORAL_TABLET | ORAL | Status: DC

## 2014-01-16 NOTE — Telephone Encounter (Signed)
Caller name: Choua Relation to pt: self Call back number: [phone] [facility] [address] [facility] [place]  Reason for call:   Patient is requesting new rx of both adderall's, xanax, hydrocodone, flexeril, Iron, b12, omeprazole. 90 day supply of all.   [facility]12 [facility] on file but directions aren't specific enough.

## 2014-01-16 NOTE — Telephone Encounter (Signed)
Please ad vise if 90 day supply appropriate. Last filled 11/03/13 #90 UDS 10/08/12  Please advise    KP

## 2014-01-18 ENCOUNTER — Emergency Department: Payer: Self-pay | Admitting: Emergency Medicine

## 2014-01-19 ENCOUNTER — Emergency Department: Payer: Self-pay | Admitting: Emergency Medicine

## 2014-01-19 ENCOUNTER — Other Ambulatory Visit: Payer: Self-pay | Admitting: Family Medicine

## 2014-01-19 ENCOUNTER — Telehealth: Payer: Self-pay | Admitting: Family Medicine

## 2014-01-19 NOTE — Telephone Encounter (Signed)
Msg left to call the office     KP 

## 2014-01-19 NOTE — Telephone Encounter (Signed)
Caller name: Randa EvensJoanne Relation to pt: self Call back number: [phone] [facility] [place]   Reason for call:   Patient states that she was bit by a dog last Friday night and required 18 stitches. She states that animal control told her that the dog had prior rabies shots but that it had expired 8 months ago. She states that animal control said that the dog was in good condition and will be monitored for 10 days. Patient wants to know if she should proceed with rabies shots???   Patient went to [facility] and was only given 6 tablets of percocet and is now out and is still in pain. Patient would like to know if more could be called in for her?

## 2014-01-19 NOTE — Telephone Encounter (Signed)
Patient aware and said she would go back to the ED for rabies shot and to get percocet.Marland Kitchen.      KP

## 2014-01-19 NOTE — Telephone Encounter (Signed)
Yes with shots----done in ER Percocet can not be called in -- and she would have to be seen

## 2014-01-19 NOTE — Telephone Encounter (Signed)
To MD to review and advise      KP 

## 2014-01-26 ENCOUNTER — Ambulatory Visit (INDEPENDENT_AMBULATORY_CARE_PROVIDER_SITE_OTHER): Payer: Managed Care, Other (non HMO) | Admitting: Family Medicine

## 2014-01-26 ENCOUNTER — Encounter: Payer: Self-pay | Admitting: Family Medicine

## 2014-01-26 VITALS — BP 126/82 | HR 78 | Temp 97.7°F | Ht 66.0 in | Wt 229.4 lb

## 2014-01-26 DIAGNOSIS — D509 Iron deficiency anemia, unspecified: Secondary | ICD-10-CM

## 2014-01-26 DIAGNOSIS — Z0000 Encounter for general adult medical examination without abnormal findings: Secondary | ICD-10-CM

## 2014-01-26 DIAGNOSIS — Z4802 Encounter for removal of sutures: Secondary | ICD-10-CM

## 2014-01-26 MED ORDER — NONFORMULARY OR COMPOUNDED ITEM: Status: DC

## 2014-01-26 NOTE — Progress Notes (Signed)
Subjective:     Erin Howe is a 44 y.o. female and is here for a comprehensive physical exam. The patient reports problems - pt has stiches from dog bite-- they need to be removed.  History   Social History  . Marital Status: Married    Spouse Name: N/A    Number of Children: N/A  . Years of Education: N/A   Occupational History  . 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 Smoking status: Never Smoker   . Smokeless tobacco: Not on file  . Alcohol Use: No  . Drug Use: No  . Sexual Activity:    Partners: Male   Other Topics Concern  . Not on file   Social History Narrative   Exercise-- walking, water aerobics   Health Maintenance  Topic Date Due  . PNEUMOCOCCAL POLYSACCHARIDE VACCINE (1) 04/10/1970  . OPHTHALMOLOGY EXAM  04/10/1978  . MAMMOGRAM  04/10/1986  . URINE MICROALBUMIN  08/14/2007  . FOOT EXAM  07/18/2008  . PAP SMEAR  07/21/2012  . HEMOGLOBIN A1C  04/08/2013  . INFLUENZA VACCINE  08/03/2014  . TETANUS/TDAP  07/21/2020    The following portions of the patient's history were reviewed and updated as appropriate:  She  has a past medical history of Diabetes mellitus type II. She  does not have any pertinent problems on file. She  has past surgical history that includes Gastric bypass (02-03-08); Wisdom tooth extraction; and Refractive surgery (2006). Her family history includes Atrial fibrillation in her mother; Deep vein thrombosis in her mother; Diabetes in her mother and sister; Heart disease in her maternal grandmother; Heart failure in her mother; Hyperlipidemia in her father and mother; Hypertension in her father and mother; Stroke in her maternal grandfather. She  reports that she has never smoked. She does not have any smokeless tobacco history on file. She reports that she does not drink alcohol or use illicit drugs. She has a current medication list which includes the following prescription(s): alprazolam,  amphetamine-dextroamphetamine, amphetamine-dextroamphetamine, cyanocobalamin, cyclobenzaprine, integra plus, hydrocodone-acetaminophen, loratadine, omeprazole, and NONFORMULARY OR COMPOUNDED ITEM. Current Outpatient Prescriptions on File Prior to Visit  Medication Sig Dispense Refill  . ALPRAZolam (XANAX) 0.5 MG tablet Take 1 tablet (0.5 mg total) by mouth 3 (three) times daily as needed for anxiety or sleep. 90 tablet 1  . amphetamine-dextroamphetamine (ADDERALL XR) 30 MG 24 hr capsule Take 1 capsule (30 mg total) by mouth every morning. 90 capsule 0  . amphetamine-dextroamphetamine (ADDERALL) 20 MG tablet 1 po q afternoon 90 tablet 0  . cyanocobalamin (,VITAMIN B-12,) 1000 MCG/ML injection Inject 1 mL (1,000 mcg total) into the skin every 30 (thirty) days. Weekly x4 then 1ml 10 mL 3  . cyclobenzaprine (FLEXERIL) 10 MG tablet TAKE 1 TABLET BY MOUTH 3 TIMES A DAY AS NEEDED FOR MUSCLE SPASMS 90 tablet 0  . FeFum-FePoly-FA-B Cmp-C-Biot (INTEGRA PLUS) CAPS Take 1 capsule by mouth daily. 90 capsule 3  . HYDROcodone-acetaminophen (NORCO/VICODIN) 5-325 MG per tablet Take 1 tablet by mouth every 6 (six) hours as needed for moderate pain. 30 tablet 0  . loratadine (CLARITIN) 10 MG tablet Take 1 tablet (10 mg total) by mouth daily. 30 tablet 11  . omeprazole (PRILOSEC) 40 MG capsule Take 1 capsule (40 mg total) by mouth daily. 90 capsule 3   No current facility-administered medications on file prior to visit.   She is allergic to amoxicillin; penicillins; and tetanus toxoid..  Review of Systems  Review  of Systems  Constitutional: Negative for activity change, appetite change and fatigue.  HENT: Negative for hearing loss, congestion, tinnitus and ear discharge.  dentist [id number] Eyes: Negative for visual disturbance (see optho q1y -- vision corrected to 20/20 with glasses).  Respiratory: Negative for cough, chest tightness and shortness of breath.   Cardiovascular: Negative for chest pain, palpitations and  leg swelling.  Gastrointestinal: Negative for abdominal pain, diarrhea, constipation and abdominal distention.  Genitourinary: Negative for urgency, frequency, decreased urine volume and difficulty urinating.  Musculoskeletal: Negative for back pain, arthralgias and gait problem.  Skin: Negative for color change, pallor and rash.  Neurological: Negative for dizziness, Montanaro-headedness, numbness and headaches.  Hematological: Negative for adenopathy. Does not bruise/bleed easily.  Psychiatric/Behavioral: Negative for suicidal ideas, confusion, sleep disturbance, self-injury, dysphoric mood, decreased concentration and agitation.      Objective:    BP 126/82 mmHg  Pulse 78  Temp(Src) 97.7 F (36.5 C) (Oral)  Ht 5\' 6"  (1.676 m)  Wt 229 lb 6.4 oz (104.055 kg)  BMI 37.04 kg/m2  SpO2 99% General appearance: alert, cooperative, appears stated age and no distress Head: Normocephalic, without obvious abnormality, atraumatic Eyes: conjunctivae/corneas clear. PERRL, EOM's intact. Fundi benign. Ears: normal TM's and external ear canals both ears Nose: Nares normal. Septum midline. Mucosa normal. No drainage or sinus tenderness. Throat: lips, mucosa, and tongue normal; teeth and gums normal Neck: no adenopathy, no carotid bruit, no JVD, supple, symmetrical, trachea midline and thyroid not enlarged, symmetric, no tenderness/mass/nodules Back: symmetric, no curvature. ROM normal. No CVA tenderness. Lungs: clear to auscultation bilaterally Breasts: gyn Heart: S1, S2 normal Abdomen: soft, non-tender; bowel sounds normal; no masses,  no organomegaly Pelvic: deferred-gyn Extremities: extremities normal, atraumatic, no cyanosis or edema Pulses: 2+ and symmetric Skin: Skin color, texture, turgor normal. No rashes or lesions Lymph nodes: Cervical, supraclavicular, and axillary nodes normal. Neurologic: normal Psych-- no depression, anxiety      Assessment:    Healthy female exam.      Plan:     ghm utd Check labs See After Visit Summary for Counseling Recommendations     2. Anemia, iron deficiency Pt is not taking iron supp--- restart iron , take with stool softener and recheck labs in 1 month - CBC with Differential/Platelet; Future - IBC panel; Future - Ferritin; Future - NONFORMULARY OR COMPOUNDED ITEM; Cbcd, ibc , ferritin dx iron def anemia  Dispense: 1 each; Refill:   3.dog bite--- sutures done in ER     Removed with no complications      Pt unable to get Tdap

## 2014-01-26 NOTE — Progress Notes (Signed)
Pre visit review using our clinic review tool, if applicable. No additional management support is needed unless otherwise documented below in the visit note. 

## 2014-01-26 NOTE — Patient Instructions (Signed)
Preventive Care for Adults A healthy lifestyle and preventive care can promote health and wellness. Preventive health guidelines for women include the following key practices.  A routine yearly physical is a good way to check with your health care provider about your health and preventive screening. It is a chance to share any concerns and updates on your health and to receive a thorough exam.  Visit your dentist for a routine exam and preventive care every 6 months. Brush your teeth twice a day and floss once a day. Good oral hygiene prevents tooth decay and gum disease.  The frequency of eye exams is based on your age, health, family medical history, use of contact lenses, and other factors. Follow your health care provider's recommendations for frequency of eye exams.  Eat a healthy diet. Foods like vegetables, fruits, whole grains, low-fat dairy products, and lean protein foods contain the nutrients you need without too many calories. Decrease your intake of foods high in solid fats, added sugars, and salt. Eat the right amount of calories for you.Get information about a proper diet from your health care provider, if necessary.  Regular physical exercise is one of the most important things you can do for your health. Most adults should get at least 150 minutes of moderate-intensity exercise (any activity that increases your heart rate and causes you to sweat) each week. In addition, most adults need muscle-strengthening exercises on 2 or more days a week.  Maintain a healthy weight. The body mass index (BMI) is a screening tool to identify possible weight problems. It provides an estimate of body fat based on height and weight. Your health care provider can find your BMI and can help you achieve or maintain a healthy weight.For adults 20 years and older:  A BMI below 18.5 is considered underweight.  A BMI of 18.5 to 24.9 is normal.  A BMI of 25 to 29.9 is considered overweight.  A BMI of  30 and above is considered obese.  Maintain normal blood lipids and cholesterol levels by exercising and minimizing your intake of saturated fat. Eat a balanced diet with plenty of fruit and vegetables. Blood tests for lipids and cholesterol should begin at age 76 and be repeated every 5 years. If your lipid or cholesterol levels are high, you are over 50, or you are at high risk for heart disease, you may need your cholesterol levels checked more frequently.Ongoing high lipid and cholesterol levels should be treated with medicines if diet and exercise are not working.  If you smoke, find out from your health care provider how to quit. If you do not use tobacco, do not start.  Lung cancer screening is recommended for adults aged 22-80 years who are at high risk for developing lung cancer because of a history of smoking. A yearly low-dose CT scan of the lungs is recommended for people who have at least a 30-pack-year history of smoking and are a current smoker or have quit within the past 15 years. A pack year of smoking is smoking an average of 1 pack of cigarettes a day for 1 year (for example: 1 pack a day for 30 years or 2 packs a day for 15 years). Yearly screening should continue until the smoker has stopped smoking for at least 15 years. Yearly screening should be stopped for people who develop a health problem that would prevent them from having lung cancer treatment.  If you are pregnant, do not drink alcohol. If you are breastfeeding,  be very cautious about drinking alcohol. If you are not pregnant and choose to drink alcohol, do not have more than 1 drink per day. One drink is considered to be 12 ounces (355 mL) of beer, 5 ounces (148 mL) of wine, or 1.5 ounces (44 mL) of liquor.  Avoid use of street drugs. Do not share needles with anyone. Ask for help if you need support or instructions about stopping the use of drugs.  High blood pressure causes heart disease and increases the risk of  stroke. Your blood pressure should be checked at least every 1 to 2 years. Ongoing high blood pressure should be treated with medicines if weight loss and exercise do not work.  If you are 51-60 years old, ask your health care provider if you should take aspirin to prevent strokes.  Diabetes screening involves taking a blood sample to check your fasting blood sugar level. This should be done once every 3 years, after age 98, if you are within normal weight and without risk factors for diabetes. Testing should be considered at a younger age or be carried out more frequently if you are overweight and have at least 1 risk factor for diabetes.  Breast cancer screening is essential preventive care for women. You should practice "breast self-awareness." This means understanding the normal appearance and feel of your breasts and may include breast self-examination. Any changes detected, no matter how small, should be reported to a health care provider. Women in their 12s and 30s should have a clinical breast exam (CBE) by a health care provider as part of a regular health exam every 1 to 3 years. After age 55, women should have a CBE every year. Starting at age 31, women should consider having a mammogram (breast X-ray test) every year. Women who have a family history of breast cancer should talk to their health care provider about genetic screening. Women at a high risk of breast cancer should talk to their health care providers about having an MRI and a mammogram every year.  Breast cancer gene (BRCA)-related cancer risk assessment is recommended for women who have family members with BRCA-related cancers. BRCA-related cancers include breast, ovarian, tubal, and peritoneal cancers. Having family members with these cancers may be associated with an increased risk for harmful changes (mutations) in the breast cancer genes BRCA1 and BRCA2. Results of the assessment will determine the need for genetic counseling and  BRCA1 and BRCA2 testing.  Routine pelvic exams to screen for cancer are no longer recommended for nonpregnant women who are considered low risk for cancer of the pelvic organs (ovaries, uterus, and vagina) and who do not have symptoms. Ask your health care provider if a screening pelvic exam is right for you.  If you have had past treatment for cervical cancer or a condition that could lead to cancer, you need Pap tests and screening for cancer for at least 20 years after your treatment. If Pap tests have been discontinued, your risk factors (such as having a new sexual partner) need to be reassessed to determine if screening should be resumed. Some women have medical problems that increase the chance of getting cervical cancer. In these cases, your health care provider may recommend more frequent screening and Pap tests.  The HPV test is an additional test that may be used for cervical cancer screening. The HPV test looks for the virus that can cause the cell changes on the cervix. The cells collected during the Pap test can be  tested for HPV. The HPV test could be used to screen women aged 30 years and older, and should be used in women of any age who have unclear Pap test results. After the age of 30, women should have HPV testing at the same frequency as a Pap test.  Colorectal cancer can be detected and often prevented. Most routine colorectal cancer screening begins at the age of 50 years and continues through age 75 years. However, your health care provider may recommend screening at an earlier age if you have risk factors for colon cancer. On a yearly basis, your health care provider may provide home test kits to check for hidden blood in the stool. Use of a small camera at the end of a tube, to directly examine the colon (sigmoidoscopy or colonoscopy), can detect the earliest forms of colorectal cancer. Talk to your health care provider about this at age 50, when routine screening begins. Direct  exam of the colon should be repeated every 5-10 years through age 75 years, unless early forms of pre-cancerous polyps or small growths are found.  People who are at an increased risk for hepatitis B should be screened for this virus. You are considered at high risk for hepatitis B if:  You were born in a country where hepatitis B occurs often. Talk with your health care provider about which countries are considered high risk.  Your parents were born in a high-risk country and you have not received a shot to protect against hepatitis B (hepatitis B vaccine).  You have HIV or AIDS.  You use needles to inject street drugs.  You live with, or have sex with, someone who has hepatitis B.  You get hemodialysis treatment.  You take certain medicines for conditions like cancer, organ transplantation, and autoimmune conditions.  Hepatitis C blood testing is recommended for all people born from 1945 through 1965 and any individual with known risks for hepatitis C.  Practice safe sex. Use condoms and avoid high-risk sexual practices to reduce the spread of sexually transmitted infections (STIs). STIs include gonorrhea, chlamydia, syphilis, trichomonas, herpes, HPV, and human immunodeficiency virus (HIV). Herpes, HIV, and HPV are viral illnesses that have no cure. They can result in disability, cancer, and death.  You should be screened for sexually transmitted illnesses (STIs) including gonorrhea and chlamydia if:  You are sexually active and are younger than 24 years.  You are older than 24 years and your health care provider tells you that you are at risk for this type of infection.  Your sexual activity has changed since you were last screened and you are at an increased risk for chlamydia or gonorrhea. Ask your health care provider if you are at risk.  If you are at risk of being infected with HIV, it is recommended that you take a prescription medicine daily to prevent HIV infection. This is  called preexposure prophylaxis (PrEP). You are considered at risk if:  You are a heterosexual woman, are sexually active, and are at increased risk for HIV infection.  You take drugs by injection.  You are sexually active with a partner who has HIV.  Talk with your health care provider about whether you are at high risk of being infected with HIV. If you choose to begin PrEP, you should first be tested for HIV. You should then be tested every 3 months for as long as you are taking PrEP.  Osteoporosis is a disease in which the bones lose minerals and strength   with aging. This can result in serious bone fractures or breaks. The risk of osteoporosis can be identified using a bone density scan. Women ages 65 years and over and women at risk for fractures or osteoporosis should discuss screening with their health care providers. Ask your health care provider whether you should take a calcium supplement or vitamin D to reduce the rate of osteoporosis.  Menopause can be associated with physical symptoms and risks. Hormone replacement therapy is available to decrease symptoms and risks. You should talk to your health care provider about whether hormone replacement therapy is right for you.  Use sunscreen. Apply sunscreen liberally and repeatedly throughout the day. You should seek shade when your shadow is shorter than you. Protect yourself by wearing long sleeves, pants, a wide-brimmed hat, and sunglasses year round, whenever you are outdoors.  Once a month, do a whole body skin exam, using a mirror to look at the skin on your back. Tell your health care provider of new moles, moles that have irregular borders, moles that are larger than a pencil eraser, or moles that have changed in shape or color.  Stay current with required vaccines (immunizations).  Influenza vaccine. All adults should be immunized every year.  Tetanus, diphtheria, and acellular pertussis (Td, Tdap) vaccine. Pregnant women should  receive 1 dose of Tdap vaccine during each pregnancy. The dose should be obtained regardless of the length of time since the last dose. Immunization is preferred during the 27th-36th week of gestation. An adult who has not previously received Tdap or who does not know her vaccine status should receive 1 dose of Tdap. This initial dose should be followed by tetanus and diphtheria toxoids (Td) booster doses every 10 years. Adults with an unknown or incomplete history of completing a 3-dose immunization series with Td-containing vaccines should begin or complete a primary immunization series including a Tdap dose. Adults should receive a Td booster every 10 years.  Varicella vaccine. An adult without evidence of immunity to varicella should receive 2 doses or a second dose if she has previously received 1 dose. Pregnant females who do not have evidence of immunity should receive the first dose after pregnancy. This first dose should be obtained before leaving the health care facility. The second dose should be obtained 4-8 weeks after the first dose.  Human papillomavirus (HPV) vaccine. Females aged 13-26 years who have not received the vaccine previously should obtain the 3-dose series. The vaccine is not recommended for use in pregnant females. However, pregnancy testing is not needed before receiving a dose. If a female is found to be pregnant after receiving a dose, no treatment is needed. In that case, the remaining doses should be delayed until after the pregnancy. Immunization is recommended for any person with an immunocompromised condition through the age of 26 years if she did not get any or all doses earlier. During the 3-dose series, the second dose should be obtained 4-8 weeks after the first dose. The third dose should be obtained 24 weeks after the first dose and 16 weeks after the second dose.  Zoster vaccine. One dose is recommended for adults aged 60 years or older unless certain conditions are  present.  Measles, mumps, and rubella (MMR) vaccine. Adults born before 1957 generally are considered immune to measles and mumps. Adults born in 1957 or later should have 1 or more doses of MMR vaccine unless there is a contraindication to the vaccine or there is laboratory evidence of immunity to   each of the three diseases. A routine second dose of MMR vaccine should be obtained at least 28 days after the first dose for students attending postsecondary schools, health care workers, or international travelers. People who received inactivated measles vaccine or an unknown type of measles vaccine during 1963-1967 should receive 2 doses of MMR vaccine. People who received inactivated mumps vaccine or an unknown type of mumps vaccine before 1979 and are at high risk for mumps infection should consider immunization with 2 doses of MMR vaccine. For females of childbearing age, rubella immunity should be determined. If there is no evidence of immunity, females who are not pregnant should be vaccinated. If there is no evidence of immunity, females who are pregnant should delay immunization until after pregnancy. Unvaccinated health care workers born before 1957 who lack laboratory evidence of measles, mumps, or rubella immunity or laboratory confirmation of disease should consider measles and mumps immunization with 2 doses of MMR vaccine or rubella immunization with 1 dose of MMR vaccine.  Pneumococcal 13-valent conjugate (PCV13) vaccine. When indicated, a person who is uncertain of her immunization history and has no record of immunization should receive the PCV13 vaccine. An adult aged 19 years or older who has certain medical conditions and has not been previously immunized should receive 1 dose of PCV13 vaccine. This PCV13 should be followed with a dose of pneumococcal polysaccharide (PPSV23) vaccine. The PPSV23 vaccine dose should be obtained at least 8 weeks after the dose of PCV13 vaccine. An adult aged 19  years or older who has certain medical conditions and previously received 1 or more doses of PPSV23 vaccine should receive 1 dose of PCV13. The PCV13 vaccine dose should be obtained 1 or more years after the last PPSV23 vaccine dose.  Pneumococcal polysaccharide (PPSV23) vaccine. When PCV13 is also indicated, PCV13 should be obtained first. All adults aged 65 years and older should be immunized. An adult younger than age 65 years who has certain medical conditions should be immunized. Any person who resides in a nursing home or long-term care facility should be immunized. An adult smoker should be immunized. People with an immunocompromised condition and certain other conditions should receive both PCV13 and PPSV23 vaccines. People with human immunodeficiency virus (HIV) infection should be immunized as soon as possible after diagnosis. Immunization during chemotherapy or radiation therapy should be avoided. Routine use of PPSV23 vaccine is not recommended for American Indians, [place] Natives, or people younger than 65 years unless there are medical conditions that require PPSV23 vaccine. When indicated, people who have unknown immunization and have no record of immunization should receive PPSV23 vaccine. One-time revaccination 5 years after the first dose of PPSV23 is recommended for people aged 19-64 years who have chronic kidney failure, nephrotic syndrome, asplenia, or immunocompromised conditions. People who received 1-2 doses of PPSV23 before age 65 years should receive another dose of PPSV23 vaccine at age 65 years or later if at least 5 years have passed since the previous dose. Doses of PPSV23 are not needed for people immunized with PPSV23 at or after age 65 years.  Meningococcal vaccine. Adults with asplenia or persistent complement component deficiencies should receive 2 doses of quadrivalent meningococcal conjugate (MenACWY-D) vaccine. The doses should be obtained at least 2 months apart.  Microbiologists working with certain meningococcal bacteria, military recruits, people at risk during an outbreak, and people who travel to or live in countries with a high rate of meningitis should be immunized. A first-year college student up through age   21 years who is living in a residence hall should receive a dose if she did not receive a dose on or after her 16th birthday. Adults who have certain high-risk conditions should receive one or more doses of vaccine.  Hepatitis A vaccine. Adults who wish to be protected from this disease, have certain high-risk conditions, work with hepatitis A-infected animals, work in hepatitis A research labs, or travel to or work in countries with a high rate of hepatitis A should be immunized. Adults who were previously unvaccinated and who anticipate close contact with an international adoptee during the first 60 days after arrival in the [place] from a country with a high rate of hepatitis A should be immunized.  Hepatitis B vaccine. Adults who wish to be protected from this disease, have certain high-risk conditions, may be exposed to blood or other infectious body fluids, are household contacts or sex partners of hepatitis B positive people, are clients or workers in certain care facilities, or travel to or work in countries with a high rate of hepatitis B should be immunized.  Haemophilus influenzae type b (Hib) vaccine. A previously unvaccinated person with asplenia or sickle cell disease or having a scheduled splenectomy should receive 1 dose of Hib vaccine. Regardless of previous immunization, a recipient of a hematopoietic stem cell transplant should receive a 3-dose series 6-12 months after her successful transplant. Hib vaccine is not recommended for adults with HIV infection. Preventive Services / Frequency Ages 64 to 68 years  Blood pressure check.** / Every 1 to 2 years.  Lipid and cholesterol check.** / Every 5 years beginning at age  22.  Clinical breast exam.** / Every 3 years for women in their 88s and 53s.  BRCA-related cancer risk assessment.** / For women who have family members with a BRCA-related cancer (breast, ovarian, tubal, or peritoneal cancers).  Pap test.** / Every 2 years from ages 90 through 51. Every 3 years starting at age 21 through age 56 or 3 with a history of 3 consecutive normal Pap tests.  HPV screening.** / Every 3 years from ages 24 through ages 1 to 46 with a history of 3 consecutive normal Pap tests.  Hepatitis C blood test.** / For any individual with known risks for hepatitis C.  Skin self-exam. / Monthly.  Influenza vaccine. / Every year.  Tetanus, diphtheria, and acellular pertussis (Tdap, Td) vaccine.** / Consult your health care provider. Pregnant women should receive 1 dose of Tdap vaccine during each pregnancy. 1 dose of Td every 10 years.  Varicella vaccine.** / Consult your health care provider. Pregnant females who do not have evidence of immunity should receive the first dose after pregnancy.  HPV vaccine. / 3 doses over 6 months, if 72 and younger. The vaccine is not recommended for use in pregnant females. However, pregnancy testing is not needed before receiving a dose.  Measles, mumps, rubella (MMR) vaccine.** / You need at least 1 dose of MMR if you were born in 1957 or later. You may also need a 2nd dose. For females of childbearing age, rubella immunity should be determined. If there is no evidence of immunity, females who are not pregnant should be vaccinated. If there is no evidence of immunity, females who are pregnant should delay immunization until after pregnancy.  Pneumococcal 13-valent conjugate (PCV13) vaccine.** / Consult your health care provider.  Pneumococcal polysaccharide (PPSV23) vaccine.** / 1 to 2 doses if you smoke cigarettes or if you have certain conditions.  Meningococcal vaccine.** /  1 dose if you are age 19 to 21 years and a first-year college  student living in a residence hall, or have one of several medical conditions, you need to get vaccinated against meningococcal disease. You may also need additional booster doses.  Hepatitis A vaccine.** / Consult your health care provider.  Hepatitis B vaccine.** / Consult your health care provider.  Haemophilus influenzae type b (Hib) vaccine.** / Consult your health care provider. Ages 40 to 64 years  Blood pressure check.** / Every 1 to 2 years.  Lipid and cholesterol check.** / Every 5 years beginning at age 20 years.  Lung cancer screening. / Every year if you are aged 55-80 years and have a 30-pack-year history of smoking and currently smoke or have quit within the past 15 years. Yearly screening is stopped once you have quit smoking for at least 15 years or develop a health problem that would prevent you from having lung cancer treatment.  Clinical breast exam.** / Every year after age 40 years.  BRCA-related cancer risk assessment.** / For women who have family members with a BRCA-related cancer (breast, ovarian, tubal, or peritoneal cancers).  Mammogram.** / Every year beginning at age 40 years and continuing for as long as you are in good health. Consult with your health care provider.  Pap test.** / Every 3 years starting at age 30 years through age 65 or 70 years with a history of 3 consecutive normal Pap tests.  HPV screening.** / Every 3 years from ages 30 years through ages 65 to 70 years with a history of 3 consecutive normal Pap tests.  Fecal occult blood test (FOBT) of stool. / Every year beginning at age 50 years and continuing until age 75 years. You may not need to do this test if you get a colonoscopy every 10 years.  Flexible sigmoidoscopy or colonoscopy.** / Every 5 years for a flexible sigmoidoscopy or every 10 years for a colonoscopy beginning at age 50 years and continuing until age 75 years.  Hepatitis C blood test.** / For all people born from 1945 through  1965 and any individual with known risks for hepatitis C.  Skin self-exam. / Monthly.  Influenza vaccine. / Every year.  Tetanus, diphtheria, and acellular pertussis (Tdap/Td) vaccine.** / Consult your health care provider. Pregnant women should receive 1 dose of Tdap vaccine during each pregnancy. 1 dose of Td every 10 years.  Varicella vaccine.** / Consult your health care provider. Pregnant females who do not have evidence of immunity should receive the first dose after pregnancy.  Zoster vaccine.** / 1 dose for adults aged 60 years or older.  Measles, mumps, rubella (MMR) vaccine.** / You need at least 1 dose of MMR if you were born in 1957 or later. You may also need a 2nd dose. For females of childbearing age, rubella immunity should be determined. If there is no evidence of immunity, females who are not pregnant should be vaccinated. If there is no evidence of immunity, females who are pregnant should delay immunization until after pregnancy.  Pneumococcal 13-valent conjugate (PCV13) vaccine.** / Consult your health care provider.  Pneumococcal polysaccharide (PPSV23) vaccine.** / 1 to 2 doses if you smoke cigarettes or if you have certain conditions.  Meningococcal vaccine.** / Consult your health care provider.  Hepatitis A vaccine.** / Consult your health care provider.  Hepatitis B vaccine.** / Consult your health care provider.  Haemophilus influenzae type b (Hib) vaccine.** / Consult your health care provider. Ages 65   years and over  Blood pressure check.** / Every 1 to 2 years.  Lipid and cholesterol check.** / Every 5 years beginning at age 22 years.  Lung cancer screening. / Every year if you are aged 73-80 years and have a 30-pack-year history of smoking and currently smoke or have quit within the past 15 years. Yearly screening is stopped once you have quit smoking for at least 15 years or develop a health problem that would prevent you from having lung cancer  treatment.  Clinical breast exam.** / Every year after age 4 years.  BRCA-related cancer risk assessment.** / For women who have family members with a BRCA-related cancer (breast, ovarian, tubal, or peritoneal cancers).  Mammogram.** / Every year beginning at age 40 years and continuing for as long as you are in good health. Consult with your health care provider.  Pap test.** / Every 3 years starting at age 9 years through age 34 or 91 years with 3 consecutive normal Pap tests. Testing can be stopped between 65 and 70 years with 3 consecutive normal Pap tests and no abnormal Pap or HPV tests in the past 10 years.  HPV screening.** / Every 3 years from ages 57 years through ages 64 or 45 years with a history of 3 consecutive normal Pap tests. Testing can be stopped between 65 and 70 years with 3 consecutive normal Pap tests and no abnormal Pap or HPV tests in the past 10 years.  Fecal occult blood test (FOBT) of stool. / Every year beginning at age 15 years and continuing until age 17 years. You may not need to do this test if you get a colonoscopy every 10 years.  Flexible sigmoidoscopy or colonoscopy.** / Every 5 years for a flexible sigmoidoscopy or every 10 years for a colonoscopy beginning at age 86 years and continuing until age 71 years.  Hepatitis C blood test.** / For all people born from [birth year] through 1967 and any individual with known risks for hepatitis C.  Osteoporosis screening.** / A one-time screening for women ages 83 years and over and women at risk for fractures or osteoporosis.  Skin self-exam. / Monthly.  Influenza vaccine. / Every year.  Tetanus, diphtheria, and acellular pertussis (Tdap/Td) vaccine.** / 1 dose of Td every 10 years.  Varicella vaccine.** / Consult your health care provider.  Zoster vaccine.** / 1 dose for adults aged 61 years or older.  Pneumococcal 13-valent conjugate (PCV13) vaccine.** / Consult your health care provider.  Pneumococcal  polysaccharide (PPSV23) vaccine.** / 1 dose for all adults aged 28 years and older.  Meningococcal vaccine.** / Consult your health care provider.  Hepatitis A vaccine.** / Consult your health care provider.  Hepatitis B vaccine.** / Consult your health care provider.  Haemophilus influenzae type b (Hib) vaccine.** / Consult your health care provider. ** Family history and personal history of risk and conditions may change your health care provider's recommendations. Document Released: 02/14/2001 Document Revised: 05/05/2013 Document Reviewed: 05/16/2010 [facility] Patient Information 2015 [place], [place]. This information is not intended to replace advice given to you by your health care provider. Make sure you discuss any questions you have with your health care provider.

## 2014-02-23 ENCOUNTER — Telehealth: Payer: Self-pay | Admitting: Family Medicine

## 2014-02-23 DIAGNOSIS — F411 Generalized anxiety disorder: Secondary | ICD-10-CM

## 2014-02-23 DIAGNOSIS — M545 Low back pain, unspecified: Secondary | ICD-10-CM

## 2014-02-23 NOTE — Telephone Encounter (Signed)
Caller name: Jerrye NobleLight, Cacie S Relation to pt: self  Call back number: [phone] Pharmacy:  [id number] [place] - [address] [phone] (Phone) [phone] (Fax)         Reason for call:  Pt requesting a 90 day supply ALPRAZolam (XANAX) 0.5 MG tablet & HYDROcodone-acetaminophen. Pt would like to know if you can prescribe perocect instead of hydrocodone, hydrocodone upset her stomach. Pt also stated she would like a 90 day because its cheaper. Please advise

## 2014-02-23 NOTE — Telephone Encounter (Signed)
She said the Hydrocodone is hurting her stomach but it does not matter which one she gets.      KP

## 2014-02-23 NOTE — Telephone Encounter (Signed)
patient is requesting to change hydrocodone to percocet with 90 day supply. Last seen 1/25/1 and filled 01/16/14 #30  Please advise      KP

## 2014-02-23 NOTE — Telephone Encounter (Signed)
Why?  If pain worse she will need referral--- to either ortho for back pain or pain managment

## 2014-02-23 NOTE — Telephone Encounter (Signed)
I would prefer to keep it the same but if it is really bothering her we can discussed it--- percocet is stronger though

## 2014-02-24 MED ORDER — HYDROCODONE-ACETAMINOPHEN 5-325 MG PO TABS: 1.0000 | ORAL_TABLET | Freq: Four times a day (QID) | ORAL | Status: DC | PRN

## 2014-02-24 MED ORDER — ALPRAZOLAM 0.5 MG PO TABS: 0.5000 mg | ORAL_TABLET | Freq: Three times a day (TID) | ORAL | Status: DC | PRN

## 2014-02-24 NOTE — Telephone Encounter (Signed)
Please advise if you are going to give her 90 instead of 30.      KP

## 2014-02-24 NOTE — Telephone Encounter (Signed)
Ok to give #90 

## 2014-02-24 NOTE — Telephone Encounter (Signed)
Patient aware Rx ready for pick up.      KP 

## 2014-02-26 ENCOUNTER — Other Ambulatory Visit: Payer: Self-pay | Admitting: Family Medicine

## 2014-03-22 ENCOUNTER — Emergency Department: Payer: Self-pay | Admitting: Emergency Medicine

## 2014-03-24 ENCOUNTER — Ambulatory Visit: Payer: Self-pay | Admitting: Surgery

## 2014-03-25 ENCOUNTER — Other Ambulatory Visit: Payer: Self-pay | Admitting: Family Medicine

## 2014-03-26 NOTE — Telephone Encounter (Signed)
Last seen 01/21/14 and filled 02/26/14 #90   Please advise    KP

## 2014-03-30 ENCOUNTER — Ambulatory Visit: Payer: Self-pay | Admitting: Surgery

## 2014-03-30 LAB — BASIC METABOLIC PANEL
Anion Gap: 7 (ref 7–16)
BUN: 9 mg/dL
CALCIUM: 8.8 mg/dL — AB
CHLORIDE: 106 mmol/L
CO2: 26 mmol/L
Creatinine: 0.66 mg/dL
EGFR (African American): >60
EGFR (Non-African Amer.): >60
Glucose: 88 mg/dL
POTASSIUM: 3.6 mmol/L
Sodium: 139 mmol/L

## 2014-03-30 LAB — CBC WITH DIFFERENTIAL/PLATELET
BASOS PCT: 1 %
Basophil #: 0.1 10*3/uL (ref 0.0–0.1)
EOS ABS: 0.2 10*3/uL (ref 0.0–0.7)
EOS PCT: 3.1 %
HCT: 31.5 % — AB (ref 35.0–47.0)
HGB: 9.2 g/dL — AB (ref 12.0–16.0)
LYMPHS PCT: 18.3 %
Lymphocyte #: 1 10*3/uL (ref 1.0–3.6)
MCH: 18.2 pg — ABNORMAL LOW (ref 26.0–34.0)
MCHC: 29.1 g/dL — ABNORMAL LOW (ref 32.0–36.0)
MCV: 63 fL — ABNORMAL LOW (ref 80–100)
Monocyte #: 0.6 x10 3/mm (ref 0.2–0.9)
Monocyte %: 10 %
NEUTROS ABS: 3.8 10*3/uL (ref 1.4–6.5)
Neutrophil %: 67.6 %
Platelet: 332 10*3/uL (ref 150–440)
RBC: 5.04 10*6/uL (ref 3.80–5.20)
RDW: 20.3 % — ABNORMAL HIGH (ref 11.5–14.5)
WBC: 5.6 10*3/uL (ref 3.6–11.0)

## 2014-03-30 LAB — HEPATIC FUNCTION PANEL A (ARMC)
ALT: 16 U/L
AST: 21 U/L
Albumin: 3.6 g/dL
Alkaline Phosphatase: 72 U/L
BILIRUBIN TOTAL: 0.4 mg/dL
Bilirubin, Direct: <0.1 mg/dL
Total Protein: 6.7 g/dL

## 2014-04-07 ENCOUNTER — Ambulatory Visit
Admit: 2014-04-07 | Disposition: A | Discharge status: Home / Self Care | Payer: Self-pay | Attending: Surgery | Admitting: Surgery

## 2014-04-13 ENCOUNTER — Emergency Department
Admit: 2014-04-13 | Disposition: A | Discharge status: Home / Self Care | Payer: Self-pay | Admitting: Emergency Medicine

## 2014-04-13 LAB — BASIC METABOLIC PANEL
Anion Gap: 5 — ABNORMAL LOW (ref 7–16)
BUN: 9 mg/dL
CHLORIDE: 105 mmol/L
CREATININE: 0.62 mg/dL
Calcium, Total: 8.5 mg/dL — ABNORMAL LOW
Co2: 28 mmol/L
EGFR (African American): >60
EGFR (Non-African Amer.): >60
Glucose: 108 mg/dL — ABNORMAL HIGH
POTASSIUM: 4 mmol/L
SODIUM: 138 mmol/L

## 2014-04-13 LAB — CBC
HCT: 30.4 % — ABNORMAL LOW (ref 35.0–47.0)
HGB: 9 g/dL — ABNORMAL LOW (ref 12.0–16.0)
MCH: 18.4 pg — AB (ref 26.0–34.0)
MCHC: 29.5 g/dL — ABNORMAL LOW (ref 32.0–36.0)
MCV: 62 fL — AB (ref 80–100)
Platelet: 272 10*3/uL (ref 150–440)
RBC: 4.87 10*6/uL (ref 3.80–5.20)
RDW: 20.5 % — AB (ref 11.5–14.5)
WBC: 7.7 10*3/uL (ref 3.6–11.0)

## 2014-04-13 LAB — TROPONIN I: Troponin-I: <0.03 ng/mL

## 2014-04-14 ENCOUNTER — Telehealth: Payer: Self-pay | Admitting: Family Medicine

## 2014-04-14 DIAGNOSIS — F988 Other specified behavioral and emotional disorders with onset usually occurring in childhood and adolescence: Secondary | ICD-10-CM

## 2014-04-14 NOTE — Telephone Encounter (Signed)
Caller name:Modestine Relation to pt: self Call back number: [phone] [facility] [place]  Reason for call:   Requesting both adderall rx, hydrocodone, flexeril, b12, intrega, omeprozole

## 2014-04-15 NOTE — Telephone Encounter (Signed)
Pt has received Oxycodone from 3 different providers since 3/21: [facility]21 #30[facility]  3/28 #30 Dr Juliann PulseLundquist.  4/5 #35 Dr Excell Seltzerooper.  Will NOT prescribe and controlled substances at this time as this is a violation of her controlled substance agreement.  Will defer future prescribing to PCP

## 2014-04-15 NOTE — Telephone Encounter (Signed)
To Md for review. Message left making the patient aware Rx can not be fill until Monday.     KP

## 2014-04-15 NOTE — Telephone Encounter (Signed)
Last seen 01/26/14 and filled:  Hydrocodone 02/24/14 #90 Adderall  For 20 mg and 30xr mg po qd #90 for both Flexeril 03/26/14 #90    Please advise     KP

## 2014-04-16 NOTE — Telephone Encounter (Signed)
No refills

## 2014-04-16 NOTE — Telephone Encounter (Signed)
I made the patient aware and she verbalized understanding, she said she had a gallbladder surgery done on 04/07/14 with Dr.Cooper [facility] and they gave her the Oxycodone. She said she got it on 3 separate occasions and if we like we could call over there to them to discuss. Please advise     KP

## 2014-04-20 MED ORDER — AMPHETAMINE-DEXTROAMPHETAMINE 20 MG PO TABS: ORAL_TABLET | ORAL | Status: DC

## 2014-04-20 MED ORDER — AMPHETAMINE-DEXTROAMPHET ER 30 MG PO CP24: 30.0000 mg | ORAL_CAPSULE | ORAL | Status: DC

## 2014-04-20 NOTE — Telephone Encounter (Signed)
Since uds neg for all and she has been getting it from mult dr[facility] -- it should have been +---- we will no longer be able to rx any controlled substances for her

## 2014-04-20 NOTE — Telephone Encounter (Signed)
Patient has not had a UDS since 2014, she stated she would have the doctor who had done her surgery send a letter.  OK for Adderall only per Dr.Lowne but the patient will need to provide a UDS. The patient is aware to have her surgeon send the letter to us and her Rx was left at check in.,      KP

## 2014-04-20 NOTE — Telephone Encounter (Signed)
Pt following up and would like to know status on message below

## 2014-04-20 NOTE — Telephone Encounter (Signed)
To MD to review.     KP 

## 2014-04-21 ENCOUNTER — Telehealth: Payer: Self-pay | Admitting: Family Medicine

## 2014-04-21 DIAGNOSIS — F411 Generalized anxiety disorder: Secondary | ICD-10-CM

## 2014-04-21 MED ORDER — ALPRAZOLAM 0.5 MG PO TABS
0.5000 mg | ORAL_TABLET | Freq: Three times a day (TID) | ORAL | Status: DC | PRN
Start: 2014-04-21 — End: 2014-05-30

## 2014-04-21 NOTE — Telephone Encounter (Signed)
I spoke with the patient and made her aware she never request Xanax, I read back to her the original message and she said they girls should have put in her Xanax. Last seen 01/26/14 and filled 02/24/14 #90 UDS done today.   Please advise    KP

## 2014-04-21 NOTE — Telephone Encounter (Signed)
Refill x1 

## 2014-04-21 NOTE — Telephone Encounter (Signed)
Caller name:Koller, Gen[place] Relation to NF:AOZHpt:self Call back number:[phone] Pharmacy:  Reason for call: pt would like for dr. Laury Axonlowne to give her call, in regards to requesting rx for xanax states she did provide a list of all the medications that she needs,

## 2014-04-21 NOTE — Telephone Encounter (Signed)
Patient aware Xanax will be faxed.     KP

## 2014-04-22 ENCOUNTER — Other Ambulatory Visit: Payer: Self-pay | Admitting: Family Medicine

## 2014-04-27 LAB — SURGICAL PATHOLOGY

## 2014-05-03 NOTE — Op Note (Signed)
PATIENT NAME:  Erin Howe, Erin Howe MR#:  [id number] DATE OF BIRTH:  10/25/1970  DATE OF PROCEDURE:  04/07/2014  PREOPERATIVE DIAGNOSIS: Symptomatic cholelithiasis.   POSTOPERATIVE DIAGNOSIS: Symptomatic cholelithiasis.   PROCEDURE: Laparoscopic cholecystectomy with C-arm fluoroscopic cholangiography and cystic duct stone retrieval.  SURGEON: Mykael Batz E.  Excell Seltzerooper, M.D.   ANESTHESIA: General with endotracheal tube.   ASSISTANT: Lars MassonAlayna Klaus, PAS.  INDICATIONS: This is a patient with a history of gastric bypass Roux-en-Y for morbid obesity who presents with recurrent epigastric and right upper quadrant pain associated with fatty food intolerance, a workup showing gallstones. Preoperatively, discussed rationale for surgery, the options of observation, risk of bleeding, infection, recurrence of symptoms, failure to resolve all of her symptoms, open procedure, bile duct damage, bile duct leak, bowel injury, retained common bile duct stone any of which could require further surgery. We also discussed the inability to perform an ERCP in this patient, therefore cholangiography would be performed as well. She understood and agreed to proceed. This was all reviewed for her in the preop holding area.   FINDINGS: No adhesions to speak of in the abdominal cavity.  Initially, varus insufflation was made difficult due to the patient'Howe lax abdominal wall and ongoing morbid obesity. Therefore, a modified Hasson technique was utilized to place a 5 mm port in the periumbilical area.   C-arm fluoroscopic cholangiography demonstrated good flow in the duodenum. There was a stone at this choledochotomy of the cystic duct that was retrieved prior to cholangiography and then there was a 2nd stone identified distal to that which was retrieved.  See below. Proximal ducts were well identified as was the cystic duct.   DESCRIPTION OF PROCEDURE: The patient was induced to general anesthesia, given IV antibiotics, VTE prophylaxis was  in place. She was prepped and draped in a sterile fashion. Marcaine was infiltrated in the skin and subcutaneous tissues around the periumbilical area. Incision was made. Veress needle was placed and an attempt at pneumoperitoneum was performed, but placement and replacement of the extra-long Veress needle was not fruitful. Therefore, a modified Hasson technique was performed by enlarging the supraumbilical incision transversely dissecting down to the fascia. The fascia was opened sharply and a blunt 5 mm sheath was placed into the abdominal cavity with camera guidance.  No adhesions were noted. No sign of bowel injury was noted. Insufflation was then performed and further exploration failed to identify any sign of adhesions or damage to bowel structures by the Veress needle.   After insufflation was obtained under direct vision, a 12 mm epigastric port and 2 lateral 5 mm ports were placed. The camera was placed in the epigastric site to again view back at the periumbilical site. No adhesions were noted. No bleeding and no sign of bowel injury.   With the camera in the periumbilical site, the gallbladder was placed on tension. The peritoneum over the infundibulum was incised bluntly. The cystic duct gallbladder junction was well identified and it was clipped and incised and in so doing a stone was identified at the cystic duct.  Photos were taken.  That stone was retrieved.  Through a separate incision, an Angiocath cholangiogram catheter was placed. C-arm fluoroscopic cholangiography demonstrated good flow in the duodenum. Proximal ducts were well identified. The cystic duct had been cannulated. There was a lucency just distal to the clip that had been placed on the cystic duct distal to the choledochotomy. The cholangiogram catheter was removed by removing the clip and then milking of the duct  was performed and 2 clips were placed doubly clipping the distal cystic duct well away from the common duct.  The  choledochotomy was enlarged to allow retrieval of a small cystic duct stone. Photos were again taken.   The cystic duct was then divided and the cystic artery was well identified, doubly clipped, and divided, and the gallbladder was taken from the gallbladder fossa with electrocautery and passed out through the epigastric port site with the aid of an Endo Catch bag. Some spilled stones were retrieved at the same time.   The area was irrigated with copious amounts of normal saline. Hemostasis was adequate. There was no sign of bleeding, bile leak, or bowel injury. The camera was placed in the epigastric site again to view back to the periumbilical site. No sign of bowel injury. Therefore, pneumoperitoneum was released. All ports were removed. Fascial edges at the epigastric site were approximated with figure-of-eight 0 Vicryls.  Because the Hasson technique had involved a 5 mm port, no sutures were placed in this morbidly obese patient.   With pneumoperitoneum released and the epigastric port site closed with 0 Vicryl figure-of-eight sutures, 4-0 subcuticular Monocryl was used on all skin edges. Steri-Strips, Mastisol and sterile dressings were placed.   The patient tolerated the procedure well. There were no complications. She was taken to the recovery room in stable condition to be discharged in the care of her family. Follow up in 10 days.   ____________________________ Adah Salvage Excell Seltzer, MD rec:sp D: 04/07/2014 09:41:50 ET T: 04/07/2014 10:23:48 ET JOB#: [id number]  cc: Adah Salvage. Excell Seltzer, MD, <Dictator> Lattie Haw MD ELECTRONICALLY SIGNED 04/07/2014 11:51

## 2014-05-03 NOTE — H&P (Signed)
PATIENT NAME:  Erin Howe, [name] MR#:  [id number] DATE OF BIRTH:  06-05-1970  DATE OF ADMISSION:  04/07/2014  CHIEF COMPLAINT: Abdominal pain.   HISTORY OF PRESENT ILLNESS: This is a patient with episodic right upper quadrant pain associated with fatty food intolerance that is occasionally in the epigastrium and is crampy in nature. She has had no jaundice or acholic stools. This has been happening multiple times and she was seen in the office where our workup suggested gallstones and biliary colic. She is here for elective laparoscopic cholecystectomy.   PAST MEDICAL HISTORY: Morbid obesity, reflux disease, anemia, and attention deficit disorder.   PAST SURGICAL HISTORY: Gastric bypass in 2010, laser eye surgery, and oral surgery.   MEDICATIONS: Flexeril, Adderall, hydrocodone, Integra, omeprazole, vitamin B12.   ALLERGIES: IBUPROFEN, ASPIRIN, PENICILLINS, AND TETANUS TOXOID.   SOCIAL HISTORY: The patient rarely drinks alcohol.  Has never smoked.    FAMILY HISTORY:  Heart disease, diabetes.   REVIEW OF SYSTEMS: A complete system review is documented in the chart and negative with the exception of that mentioned in the HPI.    PHYSICAL EXAMINATION:  GENERAL: Healthy female patient weighing 228 pounds.  HEENT: No scleral icterus.  NECK: No palpable neck nodes.  CHEST: Clear to auscultation.  CARDIAC: Regular rate and rhythm.  ABDOMEN: Soft and nontender. Scars are noted.  EXTREMITIES: Without edema.  NEUROLOGIC: Grossly intact.  INTEGUMENT: No jaundice.   LABORATORY VALUES: Demonstrate an H and H of 9 and 30 with a white blood cell count of 7.3, a platelet count of 310,000. Liver function tests are within normal limits. Gallstones are noted on CT scan.   ASSESSMENT AND PLAN: This is a patient with symptomatic cholelithiasis, likely biliary colic following a gastric bypass procedure 6 years ago. I have discussed with her the rationale for offering surgery, the options of observation, the  risks of bleeding, infection, recurrence of symptoms, failure to resolve her symptoms, open procedure, bile duct damage, bile duct leak, retained common bile duct stone. This was all reviewed for her. She understood and agreed to proceed.    ____________________________ Adah Salvageichard E. Excell Seltzerooper, MD rec:bu D: 04/06/2014 15:14:08 ET T: 04/06/2014 15:20:34 ET JOB#: [id number]  cc: Adah Salvageichard E. Excell Seltzerooper, MD, <Dictator> Lattie HawICHARD E Vaishali Baise MD ELECTRONICALLY SIGNED 04/06/2014 18:03

## 2014-05-19 ENCOUNTER — Other Ambulatory Visit: Payer: Self-pay | Admitting: Family Medicine

## 2014-05-19 NOTE — Telephone Encounter (Signed)
Please advise if refill is appropriate.     KP 

## 2014-05-30 ENCOUNTER — Other Ambulatory Visit: Payer: Self-pay | Admitting: Family Medicine

## 2014-06-02 NOTE — Telephone Encounter (Signed)
Xanax Requested  Last filled 04/21/14   Per MD note UDS 04/21/14 High risk, getting med's from multiple providers but UDS neg, No more controlled med's from PCP.   Please advise if this refill is appropriate.     KP

## 2014-06-06 ENCOUNTER — Other Ambulatory Visit: Payer: Self-pay | Admitting: Family Medicine

## 2014-06-07 ENCOUNTER — Other Ambulatory Visit: Payer: Self-pay | Admitting: Family Medicine

## 2014-06-07 DIAGNOSIS — F988 Other specified behavioral and emotional disorders with onset usually occurring in childhood and adolescence: Secondary | ICD-10-CM

## 2014-06-07 DIAGNOSIS — M545 Low back pain, unspecified: Secondary | ICD-10-CM

## 2014-06-07 DIAGNOSIS — D509 Iron deficiency anemia, unspecified: Secondary | ICD-10-CM

## 2014-06-08 ENCOUNTER — Encounter: Payer: Self-pay | Admitting: Family Medicine

## 2014-06-08 ENCOUNTER — Other Ambulatory Visit: Payer: Self-pay | Admitting: Family Medicine

## 2014-06-08 DIAGNOSIS — G894 Chronic pain syndrome: Secondary | ICD-10-CM

## 2014-06-08 MED ORDER — CYCLOBENZAPRINE HCL 10 MG PO TABS: ORAL_TABLET | ORAL | Status: DC

## 2014-06-08 NOTE — Telephone Encounter (Signed)
Per 04/20/14 not from Dr.Lowne "Pt receiving pain meds from mult drs with neg UDS--- no more controlled substances from this office"   Please advise if refills appropriate.

## 2014-06-09 ENCOUNTER — Other Ambulatory Visit: Payer: Self-pay

## 2014-06-09 ENCOUNTER — Other Ambulatory Visit: Payer: Self-pay | Admitting: Family Medicine

## 2014-06-09 ENCOUNTER — Telehealth: Payer: Self-pay | Admitting: Family Medicine

## 2014-06-09 DIAGNOSIS — F988 Other specified behavioral and emotional disorders with onset usually occurring in childhood and adolescence: Secondary | ICD-10-CM

## 2014-06-09 MED ORDER — AMPHETAMINE-DEXTROAMPHET ER 30 MG PO CP24: 30.0000 mg | ORAL_CAPSULE | ORAL | Status: DC

## 2014-06-09 MED ORDER — AMPHETAMINE-DEXTROAMPHETAMINE 20 MG PO TABS: ORAL_TABLET | ORAL | Status: DC

## 2014-06-09 NOTE — Telephone Encounter (Signed)
We will give her the adderall but no more pain meds

## 2014-06-09 NOTE — Telephone Encounter (Signed)
Msg left to call the office     KP 

## 2014-06-09 NOTE — Telephone Encounter (Signed)
Relation to pt: self  Call back number: [phone]   Reason for call:  Pt did not want to elaborate states would like Kim to call her back regarding mychart message.

## 2014-06-10 ENCOUNTER — Encounter: Payer: Self-pay | Admitting: Emergency Medicine

## 2014-06-10 ENCOUNTER — Emergency Department
Admission: EM | Admit: 2014-06-10 | Discharge: 2014-06-10 | Disposition: A | Discharge status: Home / Self Care | Payer: Managed Care, Other (non HMO) | Attending: Emergency Medicine | Admitting: Emergency Medicine

## 2014-06-10 DIAGNOSIS — E119 Type 2 diabetes mellitus without complications: Secondary | ICD-10-CM | POA: Insufficient documentation

## 2014-06-10 DIAGNOSIS — Z760 Encounter for issue of repeat prescription: Secondary | ICD-10-CM | POA: Diagnosis present

## 2014-06-10 DIAGNOSIS — Z79899 Other long term (current) drug therapy: Secondary | ICD-10-CM | POA: Diagnosis not present

## 2014-06-10 DIAGNOSIS — Z880 Allergy status to penicillin: Secondary | ICD-10-CM | POA: Insufficient documentation

## 2014-06-10 DIAGNOSIS — F430 Acute stress reaction: Secondary | ICD-10-CM | POA: Insufficient documentation

## 2014-06-10 DIAGNOSIS — F411 Generalized anxiety disorder: Secondary | ICD-10-CM

## 2014-06-10 DIAGNOSIS — F419 Anxiety disorder, unspecified: Secondary | ICD-10-CM | POA: Diagnosis not present

## 2014-06-10 MED ORDER — ALPRAZOLAM 0.5 MG PO TABS: 0.5000 mg | ORAL_TABLET | Freq: Three times a day (TID) | ORAL | Status: AC | PRN

## 2014-06-10 NOTE — ED Notes (Signed)
Pt reports that she needs a refill on her Xanax. She states that her MD is out of the office and she has an appt on Tuesday. She reports that her mom passed away two weeks ago when she ran out of her medication that why she hasnt made an appt. She reports that her PCP office told her to come here for a refill. Pt calm at this time.

## 2014-06-10 NOTE — Discharge Instructions (Signed)
Stress Stress-related medical problems are becoming increasingly common. The body has a built-in physical response to stressful situations. Faced with pressure, challenge or danger, we need to react quickly. Our bodies release hormones such as cortisol and adrenaline to help do this. These hormones are part of the "fight or flight" response and affect the metabolic rate, heart rate and blood pressure, resulting in a heightened, stressed state that prepares the body for optimum performance in dealing with a stressful situation. It is likely that early man required these mechanisms to stay alive, but usually modern stresses do not call for this, and the same hormones released in today's world can damage health and reduce coping ability. CAUSES  Pressure to perform at work, at school or in sports.  Threats of physical violence.  Money worries.  Arguments.  Family conflicts.  Divorce or separation from significant other.  Bereavement.  New job or unemployment.  Changes in location.  Alcohol or drug abuse. SOMETIMES, THERE IS NO PARTICULAR REASON FOR DEVELOPING STRESS. Almost all people are at risk of being stressed at some time in their lives. It is important to know that some stress is temporary and some is long term.  Temporary stress will go away when a situation is resolved. Most people can cope with short periods of stress, and it can often be relieved by relaxing, taking a walk or getting any type of exercise, chatting through issues with friends, or having a good night's sleep.  Chronic (long-term, continuous) stress is much harder to deal with. It can be psychologically and emotionally damaging. It can be harmful both for an individual and for friends and family. SYMPTOMS Everyone reacts to stress differently. There are some common effects that help us recognize it. In times of extreme stress, people may:  Shake uncontrollably.  Breathe faster and deeper than normal  (hyperventilate).  Vomit.  For people with asthma, stress can trigger an attack.  For some people, stress may trigger migraine headaches, ulcers, and body pain. PHYSICAL EFFECTS OF STRESS MAY INCLUDE:  Loss of energy.  Skin problems.  Aches and pains resulting from tense muscles, including neck ache, backache and tension headaches.  Increased pain from arthritis and other conditions.  Irregular heart beat (palpitations).  Periods of irritability or anger.  Apathy or depression.  Anxiety (feeling uptight or worrying).  Unusual behavior.  Loss of appetite.  Comfort eating.  Lack of concentration.  Loss of, or decreased, sex-drive.  Increased smoking, drinking, or recreational drug use.  For women, missed periods.  Ulcers, joint pain, and muscle pain. Post-traumatic stress is the stress caused by any serious accident, strong emotional damage, or extremely difficult or violent experience such as rape or war. Post-traumatic stress victims can experience mixtures of emotions such as fear, shame, depression, guilt or anger. It may include recurrent memories or images that may be haunting. These feelings can last for weeks, months or even years after the traumatic event that triggered them. Specialized treatment, possibly with medicines and psychological therapies, is available. If stress is causing physical symptoms, severe distress or making it difficult for you to function as normal, it is worth seeing your caregiver. It is important to remember that although stress is a usual part of life, extreme or prolonged stress can lead to other illnesses that will need treatment. It is better to visit a doctor sooner rather than later. Stress has been linked to the development of high blood pressure and heart disease, as well as insomnia and depression.   There is no diagnostic test for stress since everyone reacts to it differently. But a caregiver will be able to spot the physical  symptoms, such as:  Headaches.  Shingles.  Ulcers. Emotional distress such as intense worry, low mood or irritability should be detected when the doctor asks pertinent questions to identify any underlying problems that might be the cause. In case there are physical reasons for the symptoms, the doctor may also want to do some tests to exclude certain conditions. If you feel that you are suffering from stress, try to identify the aspects of your life that are causing it. Sometimes you may not be able to change or avoid them, but even a small change can have a positive ripple effect. A simple lifestyle change can make all the difference. STRATEGIES THAT CAN HELP DEAL WITH STRESS:  Delegating or sharing responsibilities.  Avoiding confrontations.  Learning to be more assertive.  Regular exercise.  Avoid using alcohol or street drugs to cope.  Eating a healthy, balanced diet, rich in fruit and vegetables and proteins.  Finding humor or absurdity in stressful situations.  Never taking on more than you know you can handle comfortably.  Organizing your time better to get as much done as possible.  Talking to friends or family and sharing your thoughts and fears.  Listening to music or relaxation tapes.  Relaxation techniques like deep breathing, meditation, and yoga.  Tensing and then relaxing your muscles, starting at the toes and working up to the head and neck. If you think that you would benefit from help, either in identifying the things that are causing your stress or in learning techniques to help you relax, see a caregiver who is capable of helping you with this. Rather than relying on medications, it is usually better to try and identify the things in your life that are causing stress and try to deal with them. There are many techniques of managing stress including counseling, psychotherapy, aromatherapy, yoga, and exercise. Your caregiver can help you determine what is best  for you. Document Released: 03/11/2002 Document Revised: 12/24/2012 Document Reviewed: 02/05/2007 [employer] Patient Information 2015 [employer]. This information is not intended to replace advice given to you by your health care provider. Make sure you discuss any questions you have with your health care provider.  

## 2014-06-10 NOTE — Telephone Encounter (Signed)
I discussed with patient that Dr.Lowne decided not to fill her Controlled substances except for the Adderall because her UDS was negative for everything except the Adderall. She said she did not take the Hydrocodone because she was taking Oxy, I made her aware that was also Negative. She said she need to have her Xanax filled and wanted to know should she find a new PCP if Dr.Lowne isn't going to handle her prescriptions, she asked about the iron and B-12 I advise her to call the pharmacy because they have refills on them. She verbalized understanding and has agreed to do so. I made her aware I will forward to MD and see if she will fill the xanax and give her a call back tomorrow. She agreed.      KP

## 2014-06-10 NOTE — Telephone Encounter (Signed)
Caller name: Donne AnonJoanne Enloe Relationship to patient: self Can be reached: [phone] Pharmacy:  Reason for call: Pt upset that Dr. Laury AxonLowne has rejected prescriptions. Specifically she wants refill on Xanax prescription. She wants to talk to Dr. Laury AxonLowne if possible to find out why and what to do. Pt mom passed away 05/26/14 and she is having a hard time right now.

## 2014-06-10 NOTE — ED Provider Notes (Signed)
[facility] Emergency Department Provider Note  ____________________________________________  Time seen: Approximately 10:10 PM  I have reviewed the triage vital signs and the nursing notes.   HISTORY  Chief Complaint Medication Refill    HPI Erin Howe is a 44 y.o. female presents emergency room for refill on her Xanax. Patient states that her mom died a couple weeks ago when she missed her follow-up doctors appointment. Just decided not to get to her next appointment on Tuesday. Reports no other complaints at this time.   Past Medical History  Diagnosis Date  . Diabetes mellitus type II     Patient Active Problem List   Diagnosis Date Noted  . Severe obesity (BMI >= 40) 10/08/2012  . Insomnia due to anxiety and fear 10/08/2012  . ADD (attention deficit disorder) 08/23/2010  . BARIATRIC SURGERY STATUS 11/25/2008  . UNSPECIFIED ANEMIA 03/07/2007  . ALLERGIC RHINITIS 11/12/2006  . COUGH 11/12/2006  . UTI'S, RECURRENT 05/16/2006    Past Surgical History  Procedure Laterality Date  . Gastric bypass  02-03-08  . Wisdom tooth extraction    . Refractive surgery  2006    Current Outpatient Rx  Name  Route  Sig  Dispense  Refill  . ALPRAZolam (XANAX) 0.5 MG tablet   Oral   Take 1 tablet (0.5 mg total) by mouth 3 (three) times daily as needed for sleep or anxiety.   30 tablet   0   . amphetamine-dextroamphetamine (ADDERALL XR) 30 MG 24 hr capsule   Oral   Take 1 capsule (30 mg total) by mouth every morning.   90 capsule   0   . amphetamine-dextroamphetamine (ADDERALL) 20 MG tablet      1 po q afternoon   90 tablet   0   . cyanocobalamin (,VITAMIN B-12,) 1000 MCG/ML injection   Subcutaneous   Inject 1 mL (1,000 mcg total) into the skin every 30 (thirty) days. Weekly x4 then 1ml   10 mL   3   . cyclobenzaprine (FLEXERIL) 10 MG tablet      TAKE 1 TABLET BY MOUTH 3 TIMES A DAY AS NEEDED FOR MUSCLE SPASMS   90 tablet   0   .  FeFum-FePoly-FA-B Cmp-C-Biot (INTEGRA PLUS) CAPS   Oral   Take 1 capsule by mouth daily.   90 capsule   3   . HYDROcodone-acetaminophen (NORCO/VICODIN) 5-325 MG per tablet   Oral   Take 1 tablet by mouth every 6 (six) hours as needed for moderate pain.   90 tablet   0   . loratadine (CLARITIN) 10 MG tablet   Oral   Take 1 tablet (10 mg total) by mouth daily.   30 tablet   11   . NONFORMULARY OR COMPOUNDED ITEM      Cbcd, ibc , ferritin dx iron def anemia   1 each   0   . omeprazole (PRILOSEC) 40 MG capsule   Oral   Take 1 capsule (40 mg total) by mouth daily.   90 capsule   3     Allergies Amoxicillin; Penicillins; and Tetanus toxoid  Family History  Problem Relation Age of Onset  . Atrial fibrillation Mother   . Heart failure Mother   . Deep vein thrombosis Mother   . Diabetes Mother   . Hyperlipidemia Mother   . Hypertension Mother   . Stroke Maternal Grandfather   . Heart disease Maternal Grandmother   . Hyperlipidemia Father   . Hypertension Father   .  Diabetes Sister     Social History History  Substance Use Topics  . Smoking status: Never Smoker   . Smokeless tobacco: Not on file  . Alcohol Use: No    Review of Systems Constitutional: No fever/chills Eyes: No visual changes. ENT: No sore throat. Cardiovascular: Denies chest pain. Respiratory: Denies shortness of breath. Gastrointestinal: No abdominal pain.  No nausea, no vomiting.  No diarrhea.  No constipation. Genitourinary: Negative for dysuria. Musculoskeletal: Negative for back pain. Skin: Negative for rash. Neurological: Negative for headaches, focal weakness or numbness. Psychiatric:Mild anxiety secondary to stress and grief  10-point ROS otherwise negative.  ____________________________________________   PHYSICAL EXAM:  VITAL SIGNS: ED Triage Vitals  Enc Vitals Group     BP 06/10/14 2107 148/100 mmHg     Pulse Rate 06/10/14 2107 106     Resp --      Temp 06/10/14 2107  98.5 F (36.9 C)     Temp Source 06/10/14 2107 Oral     SpO2 06/10/14 2107 95 %     Weight 06/10/14 2107 226 lb (102.513 kg)     Height 06/10/14 2107 5\' 6"  (1.676 m)     Head Cir --      Peak Flow --      Pain Score --      Pain Loc --      Pain Edu? --      Excl. in GC? --     Constitutional: Alert and oriented. Well appearing and in no acute distress.  Cardiovascular: Normal rate, regular rhythm. Grossly normal heart sounds.  Good peripheral circulation. Respiratory: Normal respiratory effort.  No retractions. Lungs CTAB. Gastrointestinal: Soft and nontender. No distention. No abdominal bruits. No CVA tenderness. Musculoskeletal: No lower extremity tenderness nor edema.  No joint effusions. Neurologic:  Normal speech and language. No gross focal neurologic deficits are appreciated. Speech is normal. No gait instability. Skin:  Skin is warm, dry and intact. No rash noted. Psychiatric: Mood and affect are normal. Speech and behavior are normal.  ____________________________________________   LABS (all labs ordered are listed, but only abnormal results are displayed)  Labs Reviewed - No data to display ____________________________________________  EKG  Deferred ____________________________________________  RADIOLOGY  Deferred ____________________________________________   PROCEDURES  Procedure(s) performed: None  Critical Care performed: No  ____________________________________________   INITIAL IMPRESSION / ASSESSMENT AND PLAN / ED COURSE  Pertinent labs & imaging results that were available during my care of the patient were reviewed by me and considered in my medical decision making (see chart for details).  Anxiety secondary to loss of mother. Will refill her Xanax to her next appointment on Tuesday one week from yesterday. [place] controlled substance registry reviewed. Patient compliance with statements. Patient voices no other emergency medical  conditions at this visit. ____________________________________________   FINAL CLINICAL IMPRESSION(S) / ED DIAGNOSES  Final diagnoses:  Anxiety as acute reaction to exceptional stress      Evangeline Dakinharles M Ramiro Pangilinan, PA-C 06/10/14 2310  Sharyn CreamerMark Quale, MD 06/11/14 [phone]

## 2014-06-16 ENCOUNTER — Other Ambulatory Visit: Payer: Self-pay | Admitting: Family Medicine

## 2014-06-16 NOTE — Telephone Encounter (Signed)
Requesting Flexeril 10mg -Take 1 tablet by mouth three times a day as needed for muscle spasms. Last refill:06-08-14;#90,0 Last OV:01-26-14 Please advise.//AB/CMA

## 2014-11-04 ENCOUNTER — Other Ambulatory Visit: Payer: Self-pay | Admitting: Family Medicine

## 2014-11-05 NOTE — Telephone Encounter (Signed)
Last seen 01/26/14 and filled 06/16/14 #90   Please advise     KP

## 2014-11-23 ENCOUNTER — Emergency Department: Payer: Managed Care, Other (non HMO)

## 2014-11-23 ENCOUNTER — Encounter: Payer: Self-pay | Admitting: Emergency Medicine

## 2014-11-23 ENCOUNTER — Emergency Department
Admission: EM | Admit: 2014-11-23 | Discharge: 2014-11-23 | Disposition: A | Discharge status: Home / Self Care | Payer: Managed Care, Other (non HMO) | Attending: Emergency Medicine | Admitting: Emergency Medicine

## 2014-11-23 DIAGNOSIS — S3991XA Unspecified injury of abdomen, initial encounter: Secondary | ICD-10-CM | POA: Diagnosis not present

## 2014-11-23 DIAGNOSIS — R52 Pain, unspecified: Secondary | ICD-10-CM

## 2014-11-23 DIAGNOSIS — Z880 Allergy status to penicillin: Secondary | ICD-10-CM | POA: Diagnosis not present

## 2014-11-23 DIAGNOSIS — E119 Type 2 diabetes mellitus without complications: Secondary | ICD-10-CM | POA: Diagnosis not present

## 2014-11-23 DIAGNOSIS — Y9389 Activity, other specified: Secondary | ICD-10-CM | POA: Diagnosis not present

## 2014-11-23 DIAGNOSIS — S199XXA Unspecified injury of neck, initial encounter: Secondary | ICD-10-CM | POA: Insufficient documentation

## 2014-11-23 DIAGNOSIS — Y998 Other external cause status: Secondary | ICD-10-CM | POA: Diagnosis not present

## 2014-11-23 DIAGNOSIS — S6991XA Unspecified injury of right wrist, hand and finger(s), initial encounter: Secondary | ICD-10-CM | POA: Diagnosis not present

## 2014-11-23 DIAGNOSIS — Y92410 Unspecified street and highway as the place of occurrence of the external cause: Secondary | ICD-10-CM | POA: Insufficient documentation

## 2014-11-23 DIAGNOSIS — T148 Other injury of unspecified body region: Secondary | ICD-10-CM | POA: Diagnosis not present

## 2014-11-23 DIAGNOSIS — T148XXA Other injury of unspecified body region, initial encounter: Secondary | ICD-10-CM

## 2014-11-23 DIAGNOSIS — Z79899 Other long term (current) drug therapy: Secondary | ICD-10-CM | POA: Diagnosis not present

## 2014-11-23 DIAGNOSIS — S299XXA Unspecified injury of thorax, initial encounter: Secondary | ICD-10-CM | POA: Diagnosis present

## 2014-11-23 MED ORDER — TRAMADOL HCL 50 MG PO TABS: 50.0000 mg | ORAL_TABLET | Freq: Four times a day (QID) | ORAL | Status: AC | PRN
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
## 2014-12-01 ENCOUNTER — Other Ambulatory Visit: Payer: Self-pay | Admitting: Family Medicine

## 2014-12-01 DIAGNOSIS — Z1239 Encounter for other screening for malignant neoplasm of breast: Secondary | ICD-10-CM

## 2014-12-03 ENCOUNTER — Other Ambulatory Visit: Payer: Self-pay | Admitting: Family Medicine

## 2015-01-01 ENCOUNTER — Other Ambulatory Visit: Payer: Self-pay | Admitting: Family Medicine

## 2015-01-01 NOTE — Telephone Encounter (Signed)
Last seen 01/26/14 and filled 12/04/14 #90 UDS 04/20/14 no more pain med's  Please advise    KP

## 2015-09-03 ENCOUNTER — Other Ambulatory Visit: Payer: Self-pay | Admitting: Family Medicine

## 2015-10-22 ENCOUNTER — Emergency Department
Admission: EM | Admit: 2015-10-22 | Discharge: 2015-10-22 | Disposition: A | Discharge status: Home / Self Care | Payer: Self-pay | Attending: Emergency Medicine | Admitting: Emergency Medicine

## 2015-10-22 ENCOUNTER — Emergency Department: Payer: Self-pay

## 2015-10-22 DIAGNOSIS — Y999 Unspecified external cause status: Secondary | ICD-10-CM | POA: Insufficient documentation

## 2015-10-22 DIAGNOSIS — Y9389 Activity, other specified: Secondary | ICD-10-CM | POA: Insufficient documentation

## 2015-10-22 DIAGNOSIS — S40812A Abrasion of left upper arm, initial encounter: Secondary | ICD-10-CM

## 2015-10-22 DIAGNOSIS — Z79899 Other long term (current) drug therapy: Secondary | ICD-10-CM | POA: Insufficient documentation

## 2015-10-22 DIAGNOSIS — S20212A Contusion of left front wall of thorax, initial encounter: Secondary | ICD-10-CM | POA: Insufficient documentation

## 2015-10-22 DIAGNOSIS — F909 Attention-deficit hyperactivity disorder, unspecified type: Secondary | ICD-10-CM | POA: Insufficient documentation

## 2015-10-22 DIAGNOSIS — S51812A Laceration without foreign body of left forearm, initial encounter: Secondary | ICD-10-CM | POA: Insufficient documentation

## 2015-10-22 DIAGNOSIS — Y92410 Unspecified street and highway as the place of occurrence of the external cause: Secondary | ICD-10-CM | POA: Insufficient documentation

## 2015-10-22 MED ORDER — CYCLOBENZAPRINE HCL 5 MG PO TABS: 5.0000 mg | ORAL_TABLET | Freq: Three times a day (TID) | ORAL | 0 refills | Status: AC | PRN

## 2015-10-22 MED ORDER — HYDROCODONE-ACETAMINOPHEN 5-325 MG PO TABS
1.0000 | ORAL_TABLET | ORAL | Status: AC
  Administered 2015-10-22: 1 via ORAL
  Filled 2015-10-22: qty 1

## 2015-10-22 MED ORDER — IBUPROFEN 600 MG PO TABS: 600.0000 mg | ORAL_TABLET | Freq: Four times a day (QID) | ORAL | 0 refills | Status: DC | PRN

## 2015-10-22 MED ORDER — LIDOCAINE HCL (PF) 1 % IJ SOLN
2.0000 mL | Freq: Once | INTRAMUSCULAR | Status: AC
  Administered 2015-10-22: 2 mL
  Filled 2015-10-22: qty 5

## 2015-10-22 MED ORDER — HYDROCODONE-ACETAMINOPHEN 5-325 MG PO TABS: 1.0000 | ORAL_TABLET | Freq: Four times a day (QID) | ORAL | 0 refills | Status: DC | PRN

## 2015-10-22 MED ORDER — CEPHALEXIN 500 MG PO CAPS: 500.0000 mg | ORAL_CAPSULE | Freq: Four times a day (QID) | ORAL | 0 refills | Status: AC
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
## 2015-11-10 ENCOUNTER — Inpatient Hospital Stay: Payer: Self-pay | Admitting: Hematology and Oncology

## 2015-11-24 ENCOUNTER — Inpatient Hospital Stay: Payer: Managed Care, Other (non HMO)

## 2015-11-24 ENCOUNTER — Inpatient Hospital Stay: Payer: Managed Care, Other (non HMO) | Attending: Hematology and Oncology | Admitting: Hematology and Oncology

## 2015-11-24 ENCOUNTER — Encounter: Payer: Self-pay | Admitting: Hematology and Oncology

## 2015-11-24 DIAGNOSIS — K9589 Other complications of other bariatric procedure: Secondary | ICD-10-CM | POA: Insufficient documentation

## 2015-11-24 DIAGNOSIS — E559 Vitamin D deficiency, unspecified: Secondary | ICD-10-CM | POA: Insufficient documentation

## 2015-11-24 DIAGNOSIS — Z880 Allergy status to penicillin: Secondary | ICD-10-CM

## 2015-11-24 DIAGNOSIS — K909 Intestinal malabsorption, unspecified: Secondary | ICD-10-CM | POA: Diagnosis not present

## 2015-11-24 DIAGNOSIS — Z79899 Other long term (current) drug therapy: Secondary | ICD-10-CM | POA: Insufficient documentation

## 2015-11-24 DIAGNOSIS — F5089 Other specified eating disorder: Secondary | ICD-10-CM

## 2015-11-24 DIAGNOSIS — Z9884 Bariatric surgery status: Secondary | ICD-10-CM | POA: Insufficient documentation

## 2015-11-24 DIAGNOSIS — D509 Iron deficiency anemia, unspecified: Secondary | ICD-10-CM | POA: Insufficient documentation

## 2015-11-24 DIAGNOSIS — Z8719 Personal history of other diseases of the digestive system: Secondary | ICD-10-CM | POA: Insufficient documentation

## 2015-11-24 DIAGNOSIS — D508 Other iron deficiency anemias: Secondary | ICD-10-CM | POA: Insufficient documentation

## 2015-11-24 LAB — CBC WITH DIFFERENTIAL/PLATELET
BASOS ABS: 0.1 10*3/uL (ref 0–0.1)
Basophils Relative: 1 %
Eosinophils Absolute: 0.2 10*3/uL (ref 0–0.7)
Eosinophils Relative: 3 %
HEMATOCRIT: 31.5 % — AB (ref 35.0–47.0)
HEMOGLOBIN: 9.5 g/dL — AB (ref 12.0–16.0)
Lymphocytes Relative: 21 %
Lymphs Abs: 1.3 10*3/uL (ref 1.0–3.6)
MCH: 18.1 pg — ABNORMAL LOW (ref 26.0–34.0)
MCHC: 30.2 g/dL — ABNORMAL LOW (ref 32.0–36.0)
MCV: 59.8 fL — AB (ref 80.0–100.0)
Monocytes Absolute: 0.5 10*3/uL (ref 0.2–0.9)
Monocytes Relative: 9 %
NEUTROS ABS: 4.1 10*3/uL (ref 1.4–6.5)
NEUTROS PCT: 66 %
Platelets: 286 10*3/uL (ref 150–440)
RBC: 5.26 MIL/uL — AB (ref 3.80–5.20)
RDW: 21.2 % — ABNORMAL HIGH (ref 11.5–14.5)
WBC: 6.2 10*3/uL (ref 3.6–11.0)

## 2015-11-24 LAB — RETICULOCYTES
RBC.: 5.26 MIL/uL — ABNORMAL HIGH (ref 3.80–5.20)
RETIC COUNT ABSOLUTE: 52.6 10*3/uL (ref 19.0–183.0)
Retic Ct Pct: 1 % (ref 0.4–3.1)

## 2015-11-24 LAB — FERRITIN: FERRITIN: 2 ng/mL — AB (ref 11–307)

## 2015-11-24 NOTE — Assessment & Plan Note (Signed)
The patient has history of vitamin D deficiency and is not taking vitamin D replacement therapy. I will check baseline vitamin D level today I will call the patient with test results and start her on prescription strength vitamin D If she has severe deficiency

## 2015-11-24 NOTE — Progress Notes (Signed)
[facility] CONSULT NOTE  Patient Care Team: Donato Schultz, DO as PCP - General  CHIEF COMPLAINTS/PURPOSE OF CONSULTATION:  Severe iron deficiency anemia  HISTORY OF PRESENTING ILLNESS:  Erin Howe 45 y.o. female is here because of severe iron deficiency anemia  She was found to have abnormal CBC from abnormal blood work for several years. This patient has background history of gastric bypass surgery in 2011; since then, she has lost almost 200 pounds of weight I have the opportunity to review her blood count from 2010. She had normal CBC in 2010 and 2011. Starting from 2012, she started to have progressive microcytic anemia. Her most recent CBC from 08/25/2015 show white blood cell count of 6.3, hemoglobin of 8.0, MCV of 64 and platelet count of 288. Serum ferritin was low at 3 She also had history of vitamin D deficiency but is not taking vitamin D replacement therapy She self administer B12 injections at home She denies recent chest pain on exertion, shortness of breath on minimal exertion, pre-syncopal episodes, or palpitations. She complained of mild memory problem recently. She had not noticed any recent bleeding such as epistaxis, hematuria or hematochezia She is still menstruating The patient denies over the counter NSAID ingestion. She is not on antiplatelets agents. She had no prior history or diagnosis of cancer. Her age appropriate screening programs are up-to-date. She has pica with craving shellfish. She eats a variety of diet. She never donated blood or received blood transfusion The patient was prescribed oral iron supplements and she takes daily with no success of improving her anemia  MEDICAL HISTORY:  Past Medical History:  Diagnosis Date  . Anemia     SURGICAL HISTORY: Past Surgical History:  Procedure Laterality Date  . CHOLECYSTECTOMY    . GASTRIC BYPASS  02-03-08  . REFRACTIVE SURGERY  2006  . WISDOM TOOTH EXTRACTION       SOCIAL HISTORY: Social History   Social History  . Marital status: Married    Spouse name: N/A  . Number of children: N/A  . Years of education: N/A   Occupational History  . 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 Smoking status: Never Smoker  . Smokeless tobacco: Former Neurosurgeon  . Alcohol use No  . Drug use: No  . Sexual activity: Yes    Partners: Male   Other Topics Concern  . Not on file   Social History Narrative   Exercise-- walking, water aerobics    FAMILY HISTORY: Family History  Problem Relation Age of Onset  . Atrial fibrillation Mother   . Heart failure Mother   . Deep vein thrombosis Mother   . Diabetes Mother   . Hyperlipidemia Mother   . Hypertension Mother   . Hyperlipidemia Father   . Hypertension Father   . Diabetes Sister   . Stroke Maternal Grandfather   . Heart disease Maternal Grandmother     ALLERGIES:  is allergic to amoxicillin; penicillins; and tetanus toxoid.  MEDICATIONS:  Current Outpatient Prescriptions  Medication Sig Dispense Refill  . amphetamine-dextroamphetamine (ADDERALL XR) 30 MG 24 hr capsule Take by mouth.    Marland Kitchen amphetamine-dextroamphetamine (ADDERALL) 20 MG tablet 1 po q afternoon 90 tablet 0  . cyanocobalamin (,VITAMIN B-12,) 1000 MCG/ML injection Inject 1 mL (1,000 mcg total) into the skin every 30 (thirty) days. Weekly x4 then 1ml 10 mL 3  . NONFORMULARY OR COMPOUNDED ITEM Cbcd, ibc , ferritin dx iron  def anemia 1 each 0  . omeprazole (PRILOSEC) 40 MG capsule Take 1 capsule (40 mg total) by mouth daily. 90 capsule 3  . amphetamine-dextroamphetamine (ADDERALL XR) 30 MG 24 hr capsule Take 1 capsule (30 mg total) by mouth every morning. 90 capsule 0  . cyclobenzaprine (FLEXERIL) 5 MG tablet Take 1-2 tablets (5-10 mg total) by mouth 3 (three) times daily as needed for muscle spasms. (Patient not taking: Reported on 11/24/2015) 20 tablet 0  . FeFum-FePoly-FA-B Cmp-C-Biot (INTEGRA PLUS) CAPS Take 1  capsule by mouth daily. (Patient not taking: Reported on 11/24/2015) 90 capsule 3  . ferrous sulfate 325 (65 FE) MG tablet TAKE 1 TABLET (325 MG TOTAL) BY MOUTH 3 (THREE) TIMES DAILY WITH MEALS.  11  . HYDROcodone-acetaminophen (NORCO) 5-325 MG tablet Take 1 tablet by mouth every 6 (six) hours as needed for moderate pain. (Patient not taking: Reported on 11/24/2015) 20 tablet 0  . ibuprofen (ADVIL,MOTRIN) 600 MG tablet Take 1 tablet (600 mg total) by mouth every 6 (six) hours as needed for moderate pain. (Patient not taking: Reported on 11/24/2015) 30 tablet 0  . loratadine (CLARITIN) 10 MG tablet Take 1 tablet (10 mg total) by mouth daily. (Patient not taking: Reported on 11/24/2015) 30 tablet 11  . meloxicam (MOBIC) 7.5 MG tablet      No current facility-administered medications for this visit.     REVIEW OF SYSTEMS:   Constitutional: Denies fevers, chills or abnormal night sweats Eyes: Denies blurriness of vision, double vision or watery eyes Ears, nose, mouth, throat, and face: Denies mucositis or sore throat Respiratory: Denies cough, dyspnea or wheezes Cardiovascular: Denies palpitation, chest discomfort or lower extremity swelling Gastrointestinal:  Denies nausea, heartburn or change in bowel habits Skin: Denies abnormal skin rashes Lymphatics: Denies new lymphadenopathy or easy bruising Neurological:Denies numbness, tingling or new weaknesses Behavioral/Psych: Mood is stable, no new changes  All other systems were reviewed with the patient and are negative.  PHYSICAL EXAMINATION: ECOG PERFORMANCE STATUS: 1 - Symptomatic but completely ambulatory  Vitals:   11/24/15 1020  BP: 111/68  Pulse: 73  Resp: 18  Temp: (!) 96.1 F (35.6 C)   Filed Weights   11/24/15 1020  Weight: 235 lb 7.2 oz (106.8 kg)    GENERAL:alert, no distress and comfortable SKIN: skin color, texture, turgor are normal, no rashes or significant lesions. Noted loose skinfold EYES: normal, conjunctiva are  pink and non-injected, sclera clear OROPHARYNX:no exudate, no erythema and lips, buccal mucosa, and tongue normal  NECK: supple, thyroid normal size, non-tender, without nodularity LYMPH:  no palpable lymphadenopathy in the cervical, axillary or inguinal LUNGS: clear to auscultation and percussion with normal breathing effort HEART: regular rate & rhythm and no murmurs and no lower extremity edema ABDOMEN:abdomen soft, non-tender and normal bowel sounds Musculoskeletal:no cyanosis of digits and no clubbing  PSYCH: alert & oriented x 3 with fluent speech NEURO: no focal motor/sensory deficits  ASSESSMENT & PLAN:  IDA (iron deficiency anemia) The most likely cause of her severe iron deficiency anemia is due to malabsorption due to gastric bypass surgery I told her to stop oral iron supplement. She does not have severe symptoms from anemia. She is not inclined to received blood transfusion regardless of hemoglobin status The most likely cause of her anemia is due to chronic blood loss/malabsorption syndrome. We discussed some of the risks, benefits, and alternatives of intravenous iron infusions. The patient is symptomatic from anemia and the iron level is critically low. She tolerated oral  iron supplement poorly and desires to achieved higher levels of iron faster for adequate hematopoesis. Some of the side-effects to be expected including risks of infusion reactions, phlebitis, headaches, nausea and fatigue.  The patient is willing to proceed. Patient education material was dispensed.  Goal is to keep ferritin level greater than 50 I recommend she start IV iron next week, weekly 2 along with her B-12 supplements. I will recheck her blood count again in January and will call her with test results. She will likely need lifelong IV iron replacement therapy in the future due to gastric bypass surgery   Bariatric surgery status This is the culprit of her severe anemia I will check for other  mineral deficiency She will continue chronic B-12 injections for life  Vitamin D deficiency The patient has history of vitamin D deficiency and is not taking vitamin D replacement therapy. I will check baseline vitamin D level today I will call the patient with test results and start her on prescription strength vitamin D If she has severe deficiency  Orders Placed This Encounter  Procedures  . CBC & Diff and Retic    Standing Status:   Standing    Number of Occurrences:   9    Standing Expiration Date:   11/23/2016  . Ferritin    Standing Status:   Standing    Number of Occurrences:   9    Standing Expiration Date:   11/23/2016  . Vitamin D 25 hydroxy    Standing Status:   Future    Number of Occurrences:   1    Standing Expiration Date:   11/23/2016     All questions were answered. The patient knows to call the clinic with any problems, questions or concerns. I spent 40 minutes counseling the patient face to face. The total time spent in the appointment was 55 minutes and more than 50% was on counseling.     Artis DelayNi Kymberlyn Eckford, MD 11/24/15 11:42 AM

## 2015-11-24 NOTE — Assessment & Plan Note (Signed)
The most likely cause of her severe iron deficiency anemia is due to malabsorption due to gastric bypass surgery I told her to stop oral iron supplement. She does not have severe symptoms from anemia. She is not inclined to received blood transfusion regardless of hemoglobin status The most likely cause of her anemia is due to chronic blood loss/malabsorption syndrome. We discussed some of the risks, benefits, and alternatives of intravenous iron infusions. The patient is symptomatic from anemia and the iron level is critically low. She tolerated oral iron supplement poorly and desires to achieved higher levels of iron faster for adequate hematopoesis. Some of the side-effects to be expected including risks of infusion reactions, phlebitis, headaches, nausea and fatigue.  The patient is willing to proceed. Patient education material was dispensed.  Goal is to keep ferritin level greater than 50 I recommend she start IV iron next week, weekly 2 along with her B-12 supplements. I will recheck her blood count again in January and will call her with test results. She will likely need lifelong IV iron replacement therapy in the future due to gastric bypass surgery

## 2015-11-24 NOTE — Assessment & Plan Note (Addendum)
This is the culprit of her severe anemia I will check for other mineral deficiency She will continue chronic B-12 injections for life

## 2015-11-25 LAB — VITAMIN D 25 HYDROXY (VIT D DEFICIENCY, FRACTURES): VIT D 25 HYDROXY: 22.7 ng/mL — AB (ref 30.0–100.0)

## 2015-12-01 ENCOUNTER — Telehealth: Payer: Self-pay

## 2015-12-01 MED ORDER — ERGOCALCIFEROL 1.25 MG (50000 UT) PO CAPS: 50000.0000 [IU] | ORAL_CAPSULE | ORAL | 0 refills | Status: DC

## 2015-12-01 NOTE — Telephone Encounter (Signed)
Patient informed lab results and rx sent to [facility] [place].

## 2015-12-01 NOTE — Telephone Encounter (Signed)
Left message for patient to return call regarding lab results from last week:  Dr. Bertis RuddyGorsuch would like patient to start Vitamin D 50,000 units once weekly.  Waiting to talk to patient before sending rx to pharmacy.

## 2015-12-02 ENCOUNTER — Ambulatory Visit: Payer: Managed Care, Other (non HMO)

## 2015-12-03 ENCOUNTER — Inpatient Hospital Stay: Payer: Managed Care, Other (non HMO) | Attending: Hematology and Oncology

## 2015-12-03 VITALS — BP 134/83 | HR 89 | Temp 97.4°F | Resp 16

## 2015-12-03 DIAGNOSIS — D509 Iron deficiency anemia, unspecified: Secondary | ICD-10-CM | POA: Insufficient documentation

## 2015-12-03 MED ORDER — SODIUM CHLORIDE 0.9 % IV SOLN
Freq: Once | INTRAVENOUS | Status: AC
  Administered 2015-12-03: 14:00:00 via INTRAVENOUS
  Filled 2015-12-03: qty 1000

## 2015-12-03 MED ORDER — SODIUM CHLORIDE 0.9 % IV SOLN
510.0000 mg | Freq: Once | INTRAVENOUS | Status: AC
  Administered 2015-12-03: 510 mg via INTRAVENOUS
  Filled 2015-12-03: qty 17

## 2015-12-09 ENCOUNTER — Inpatient Hospital Stay: Payer: Managed Care, Other (non HMO)

## 2015-12-09 VITALS — BP 115/74 | HR 74 | Temp 97.0°F | Resp 18

## 2015-12-09 DIAGNOSIS — D509 Iron deficiency anemia, unspecified: Secondary | ICD-10-CM

## 2015-12-09 MED ORDER — SODIUM CHLORIDE 0.9 % IV SOLN
510.0000 mg | Freq: Once | INTRAVENOUS | Status: AC
  Administered 2015-12-09: 510 mg via INTRAVENOUS
  Filled 2015-12-09: qty 17

## 2015-12-09 MED ORDER — SODIUM CHLORIDE 0.9 % IV SOLN
Freq: Once | INTRAVENOUS | Status: AC
  Administered 2015-12-09: 14:00:00 via INTRAVENOUS
  Filled 2015-12-09: qty 1000

## 2015-12-22 DIAGNOSIS — R69 Illness, unspecified: Secondary | ICD-10-CM | POA: Diagnosis not present

## 2015-12-22 DIAGNOSIS — D509 Iron deficiency anemia, unspecified: Secondary | ICD-10-CM | POA: Diagnosis not present

## 2015-12-22 DIAGNOSIS — Z760 Encounter for issue of repeat prescription: Secondary | ICD-10-CM | POA: Diagnosis not present

## 2016-01-05 ENCOUNTER — Inpatient Hospital Stay: Payer: Managed Care, Other (non HMO) | Attending: Hematology and Oncology | Admitting: *Deleted

## 2016-01-05 ENCOUNTER — Other Ambulatory Visit: Payer: Self-pay | Admitting: Hematology and Oncology

## 2016-01-05 DIAGNOSIS — K909 Intestinal malabsorption, unspecified: Secondary | ICD-10-CM | POA: Insufficient documentation

## 2016-01-05 DIAGNOSIS — E559 Vitamin D deficiency, unspecified: Secondary | ICD-10-CM | POA: Diagnosis not present

## 2016-01-05 DIAGNOSIS — Z8719 Personal history of other diseases of the digestive system: Secondary | ICD-10-CM | POA: Insufficient documentation

## 2016-01-05 DIAGNOSIS — D509 Iron deficiency anemia, unspecified: Secondary | ICD-10-CM | POA: Diagnosis not present

## 2016-01-05 DIAGNOSIS — D508 Other iron deficiency anemias: Secondary | ICD-10-CM

## 2016-01-05 DIAGNOSIS — Z9884 Bariatric surgery status: Secondary | ICD-10-CM | POA: Insufficient documentation

## 2016-01-05 LAB — CBC WITH DIFFERENTIAL/PLATELET
BASOS ABS: 0.1 10*3/uL (ref 0–0.1)
BASOS PCT: 1 %
Eosinophils Absolute: 0.2 10*3/uL (ref 0–0.7)
Eosinophils Relative: 3 %
HEMATOCRIT: 43.6 % (ref 35.0–47.0)
Hemoglobin: 13.7 g/dL (ref 12.0–16.0)
Lymphocytes Relative: 21 %
Lymphs Abs: 1.6 10*3/uL (ref 1.0–3.6)
MCH: 22.7 pg — ABNORMAL LOW (ref 26.0–34.0)
MCHC: 31.4 g/dL — ABNORMAL LOW (ref 32.0–36.0)
MCV: 72.3 fL — ABNORMAL LOW (ref 80.0–100.0)
MONO ABS: 0.4 10*3/uL (ref 0.2–0.9)
Monocytes Relative: 6 %
NEUTROS ABS: 5.4 10*3/uL (ref 1.4–6.5)
Neutrophils Relative %: 69 %
Platelets: 195 10*3/uL (ref 150–440)
RBC: 6.02 MIL/uL — ABNORMAL HIGH (ref 3.80–5.20)
RDW: 32.8 % — AB (ref 11.5–14.5)
WBC: 7.7 10*3/uL (ref 3.6–11.0)

## 2016-01-05 LAB — RETICULOCYTES
RBC.: 6.02 MIL/uL — ABNORMAL HIGH (ref 3.80–5.20)
RETIC COUNT ABSOLUTE: 42.1 10*3/uL (ref 19.0–183.0)
Retic Ct Pct: 0.7 % (ref 0.4–3.1)

## 2016-01-05 LAB — FERRITIN: FERRITIN: 17 ng/mL (ref 11–307)

## 2016-01-06 ENCOUNTER — Telehealth: Payer: Self-pay | Admitting: *Deleted

## 2016-01-06 NOTE — Telephone Encounter (Signed)
Called pt and told her that Dr. Bertis Ruddygorsuch wanted  Her to know that the anemia is resolved with the venofer doses she had.  Because of her gastric bypass she had over time she will need add. Venofer.  She would like her to be seen in 3 months and 1 week prior for labs and the day she comes to see md if she needs iron it will be done that day. I did explain that Dr Bertis Ruddygorsuch will not be coming back to the cancer center after this month. She asked if the new dr understands her dx and have seen pt like this and I told her that we have a lot of pt that are status post gastric bypass that they do not absorb oral iron but does well with IV iron.  She is agreeable to come in 3 months and 1 week prior to get labs done.  I have sent inbasket to schedulers and Teodora MediciColette will call pt with appt.

## 2016-01-27 ENCOUNTER — Other Ambulatory Visit: Payer: Self-pay | Admitting: Family Medicine

## 2016-01-27 DIAGNOSIS — Z1231 Encounter for screening mammogram for malignant neoplasm of breast: Secondary | ICD-10-CM

## 2016-02-13 ENCOUNTER — Other Ambulatory Visit: Payer: Self-pay | Admitting: Hematology and Oncology

## 2016-02-22 ENCOUNTER — Ambulatory Visit: Payer: Managed Care, Other (non HMO) | Attending: Family Medicine

## 2016-03-30 ENCOUNTER — Inpatient Hospital Stay: Payer: Managed Care, Other (non HMO) | Attending: Oncology

## 2016-04-02 IMAGING — MR MRI HEAD WITHOUT AND WITH CONTRAST
10 of 11 series · 39 of 48 positions shown · IV contrast (20 ml Multihance)
Comparison: None.

CLINICAL DATA: Right asymmetric with hearing loss and tinnitus

EXAM:
MRI HEAD WITHOUT AND WITH CONTRAST
TECHNIQUE: Multiplanar, multiecho pulse sequences of the brain and surrounding
structures were obtained without and with intravenous contrast.
CONTRAST:  20 mL MultiHance IV

[Series 2: T1 · sagittal · 5.0mm · 0.45mm/px · 4 of 28 slices shown (1 of 5)]
[im 1/28]
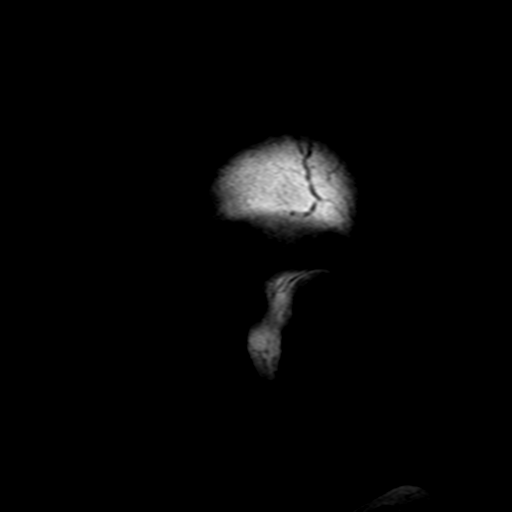
[im 10/28]
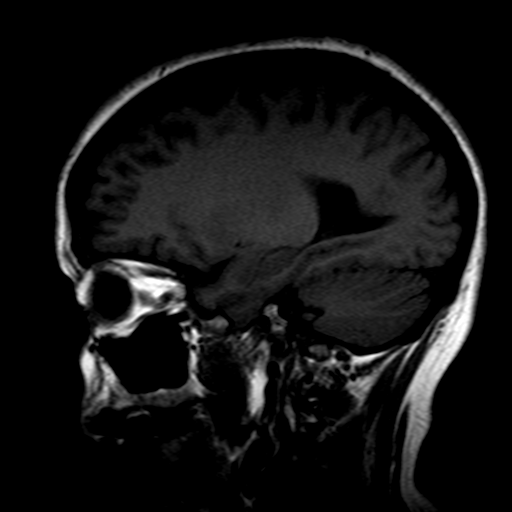
[im 19/28]
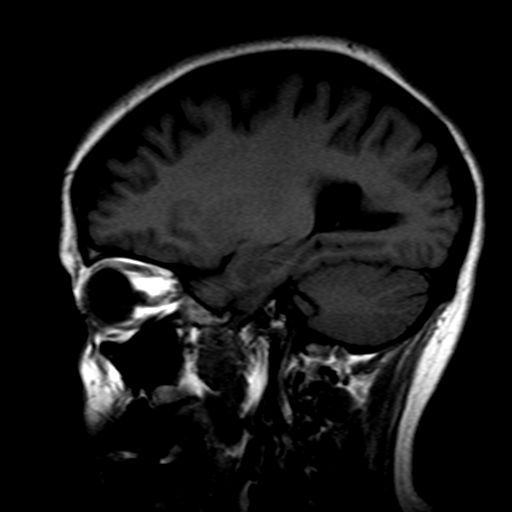
[im 28/28]
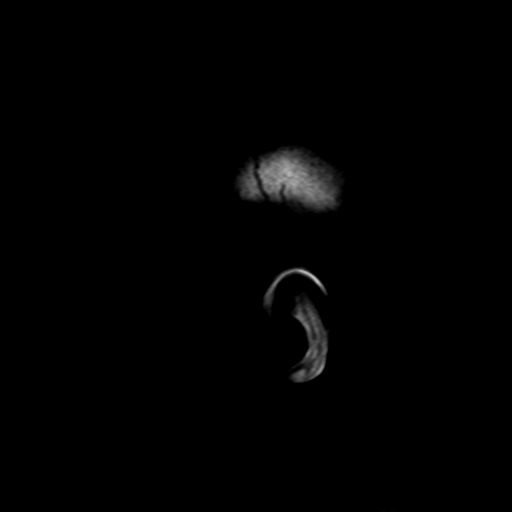

[Series 4: DWI · axial · 5.0mm · 1.80mm/px · z∈[-66,+108]mm · 4 of 28 slices shown (1 of 2)]
[im 1/28]
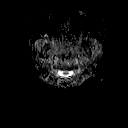
[im 10/28]
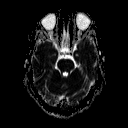
[im 19/28]
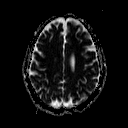
[im 28/28]
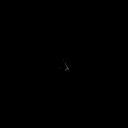

[Series 5: DWI · axial · 5.0mm · 1.80mm/px · z∈[-66,+102]mm · 4 of 27 slices shown (2 of 2)]
[im 1/27]
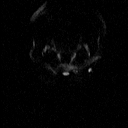
[im 9/27]
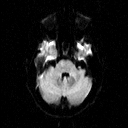
[im 18/27]
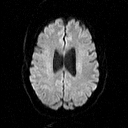
[im 27/27]
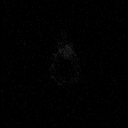

[Series 6: T2 · axial · 5.0mm · 0.45mm/px · z∈[-66,+109]mm · 5 of 28 slices shown]
[im 1/28]
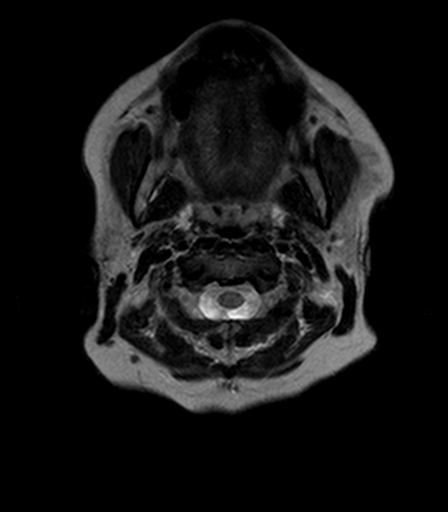
[im 7/28]
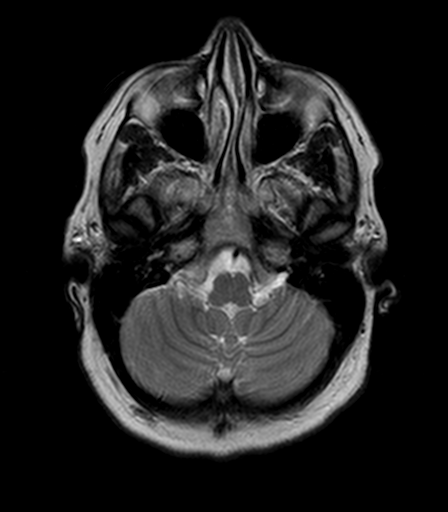
[im 14/28]
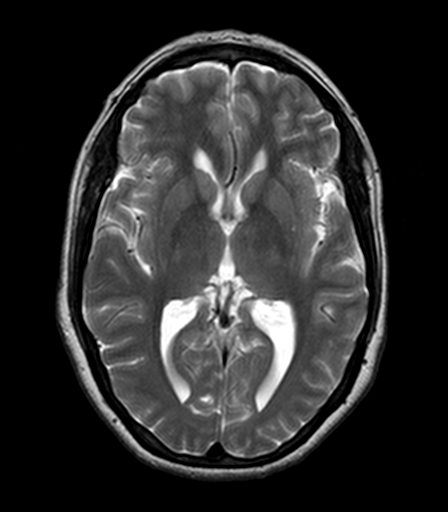
[im 21/28]
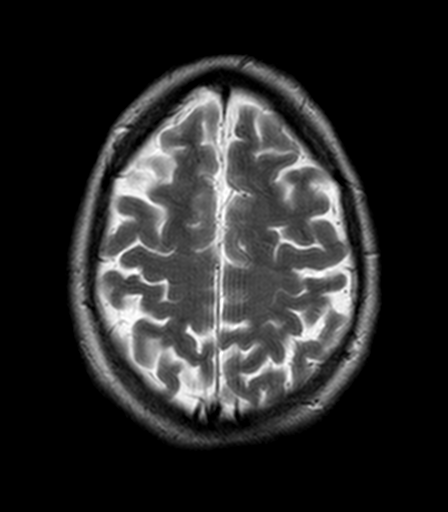
[im 28/28]
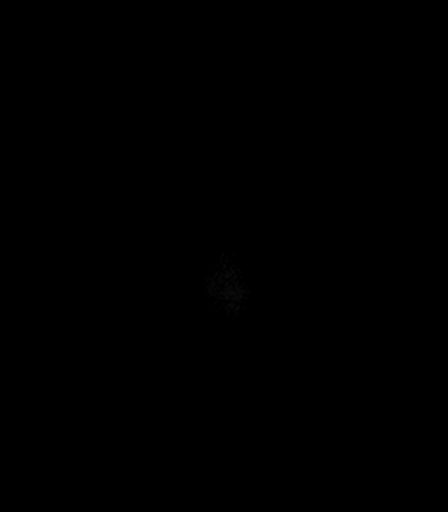

[Series 7: FLAIR · axial · 5.0mm · 0.90mm/px · z∈[-66,+109]mm · 5 of 28 slices shown]
[im 1/28]
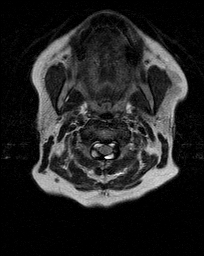
[im 7/28]
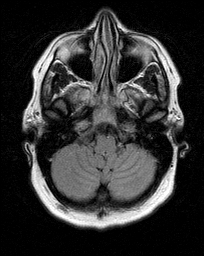
[im 14/28]
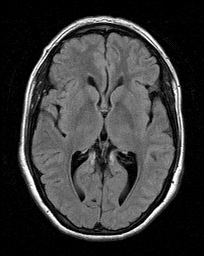
[im 21/28]
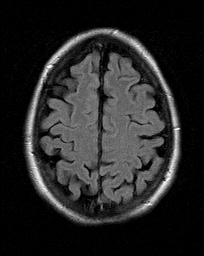
[im 28/28]
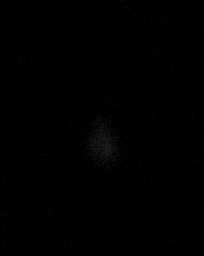

[Series 8: T1 · coronal · 3.0mm · 0.70mm/px · 2 of 11 slices shown (2 of 5)]
[im 1/11]
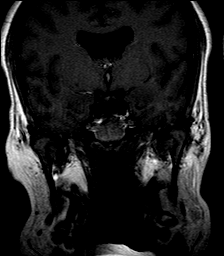
[im 11/11]
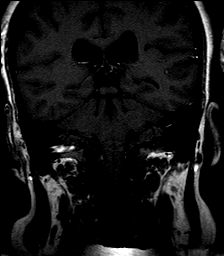

[Series 10: T1 · axial · 3.0mm · 0.70mm/px · z∈[-38,-8]mm · 2 of 11 slices shown (3 of 5)]
[im 1/11]
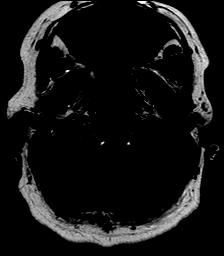
[im 11/11]
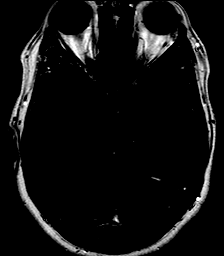

[Series 11: T1 · axial · 3.0mm · 0.70mm/px · z∈[-38,-8]mm · 2 of 11 slices shown (4 of 5)]
[im 1/11]
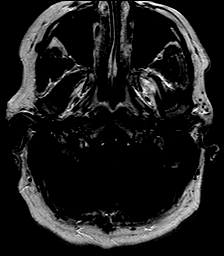
[im 11/11]
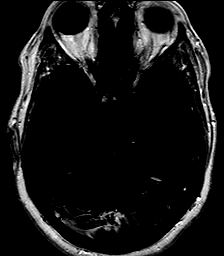

[Series 12: T1 · coronal · 3.0mm · 0.70mm/px · 2 of 11 slices shown (5 of 5)]
[im 1/11]
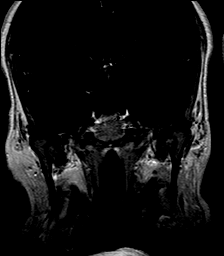
[im 11/11]
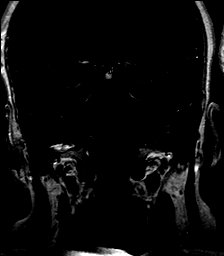

[Series 13: T1 post-contrast · axial · 3.0mm · 0.45mm/px · z∈[-61,+104]mm · 9 of 56 slices shown]
[im 1/56]
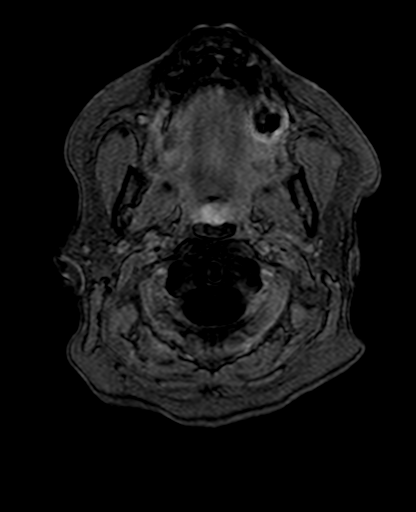
[im 7/56]
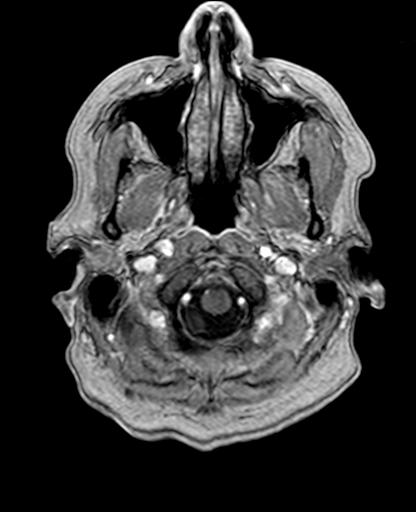
[im 14/56]
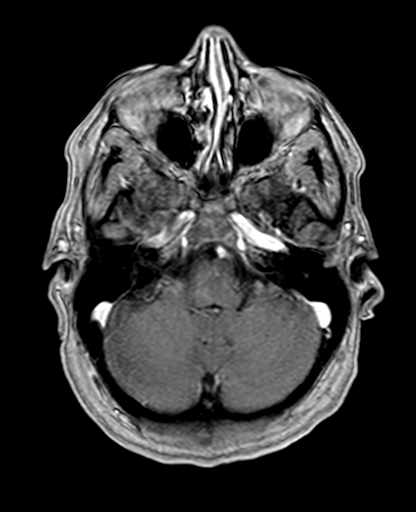
[im 21/56]
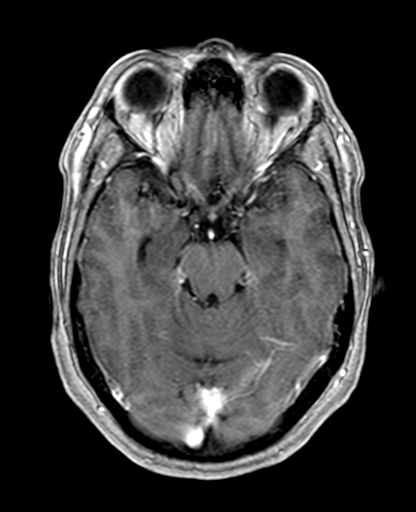
[im 28/56]
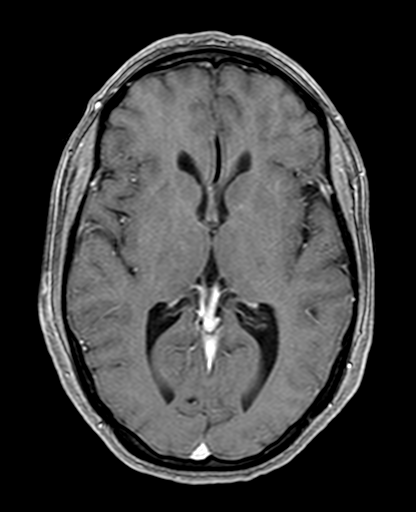
[im 35/56]
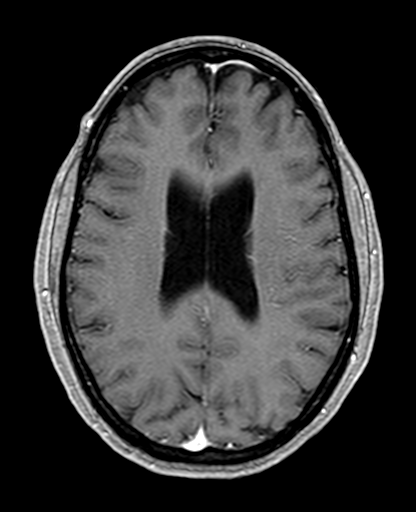
[im 42/56]
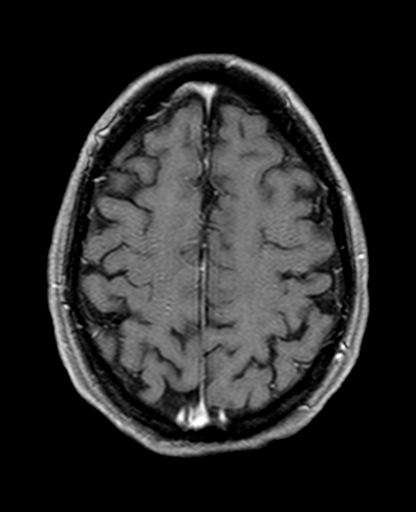
[im 49/56]
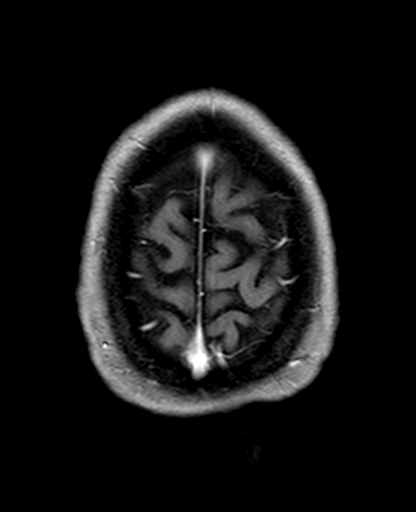
[im 56/56]
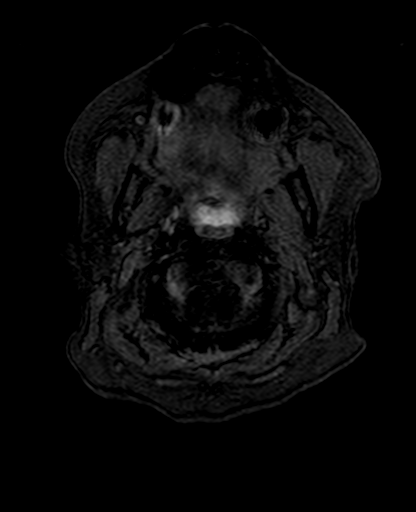

[39 of 48 positions shown; findings below may reference images not displayed]

FINDINGS: IAC protocol was performed including thin section imaging through
the posterior fossa before and after intravenous contrast.

Ventricle size is normal. Negative for acute or chronic infarct.
Negative for hemorrhage or mass. Cerebral white matter is normal.
Brainstem is normal

Seventh and eighth cranial nerves are normal. Negative for
vestibular schwannoma. No enhancing mass in the posterior fossa.
Brainstem and cerebellum are normal.

Temporal bone is normal bilaterally. Mastoid sinus is clear
bilaterally.
IMPRESSION: Negative

## 2016-04-06 ENCOUNTER — Inpatient Hospital Stay: Payer: Managed Care, Other (non HMO) | Attending: Oncology | Admitting: Oncology

## 2016-04-06 ENCOUNTER — Inpatient Hospital Stay: Payer: Managed Care, Other (non HMO)

## 2016-04-06 NOTE — Progress Notes (Deleted)
Hematology/Oncology Consult note [facility]  [phone] [phone]  Patient Care Team: Donato Schultz, DO as PCP - General   Name of the patient: Erin Howe  [id number]  09-07-1970   Date of visit: 04/06/16  Diagnosis- iron deficiency anemia  Chief complaint/ Reason for visit- routine f/u  Heme/Onc history: Patient is a 46 yr old female with a h/o gastric bypass surgery in 2011. Patient began to have progressive microcytic anemia since 2012. H/H from 8/17 was hb of 8 with mcv of 64. She also has B12 deficiency and self administers B12 shots. Patient denies any NSAID use. Oral iron has not helped her. She was started on IV iron  Interval history- ***  ECOG PS- *** Pain scale- *** Opioid associated constipation- ***  Review of systems- Review of Systems  Constitutional: Negative for chills, fever, malaise/fatigue and weight loss.  HENT: Negative for congestion, ear discharge and nosebleeds.   Eyes: Negative for blurred vision.  Respiratory: Negative for cough, hemoptysis, sputum production, shortness of breath and wheezing.   Cardiovascular: Negative for chest pain, palpitations, orthopnea and claudication.  Gastrointestinal: Negative for abdominal pain, blood in stool, constipation, diarrhea, heartburn, melena, nausea and vomiting.  Genitourinary: Negative for dysuria, flank pain, frequency, hematuria and urgency.  Musculoskeletal: Negative for back pain, joint pain and myalgias.  Skin: Negative for rash.  Neurological: Negative for dizziness, tingling, focal weakness, seizures, weakness and headaches.  Endo/Heme/Allergies: Does not bruise/bleed easily.  Psychiatric/Behavioral: Negative for depression and suicidal ideas. The patient does not have insomnia.      Current treatment- IV iron  Allergies  Allergen Reactions  . Amoxicillin   . Penicillins   . Tetanus Toxoid      Past Medical History:  Diagnosis Date    . Anemia      Past Surgical History:  Procedure Laterality Date  . CHOLECYSTECTOMY    . GASTRIC BYPASS  02-03-08  . REFRACTIVE SURGERY  2006  . WISDOM TOOTH EXTRACTION      Social History   Social History  . Marital status: Married    Spouse name: N/A  . Number of children: N/A  . Years of education: N/A   Occupational History  . 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 Smoking status: Never Smoker  . Smokeless tobacco: Former Neurosurgeon  . Alcohol use No  . Drug use: No  . Sexual activity: Yes    Partners: Male   Other Topics Concern  . Not on file   Social History Narrative   Exercise-- walking, water aerobics    Family History  Problem Relation Age of Onset  . Atrial fibrillation Mother   . Heart failure Mother   . Deep vein thrombosis Mother   . Diabetes Mother   . Hyperlipidemia Mother   . Hypertension Mother   . Hyperlipidemia Father   . Hypertension Father   . Diabetes Sister   . Stroke Maternal Grandfather   . Heart disease Maternal Grandmother      Current Outpatient Prescriptions:  .  amphetamine-dextroamphetamine (ADDERALL XR) 30 MG 24 hr capsule, Take 1 capsule (30 mg total) by mouth every morning., Disp: 90 capsule, Rfl: 0 .  amphetamine-dextroamphetamine (ADDERALL XR) 30 MG 24 hr capsule, Take by mouth., Disp: , Rfl:  .  amphetamine-dextroamphetamine (ADDERALL) 20 MG tablet, 1 po q afternoon, Disp: 90 tablet, Rfl: 0 .  cyanocobalamin (,VITAMIN B-12,) 1000 MCG/ML injection, Inject 1  mL (1,000 mcg total) into the skin every 30 (thirty) days. Weekly x4 then 1ml, Disp: 10 mL, Rfl: 3 .  cyclobenzaprine (FLEXERIL) 5 MG tablet, Take 1-2 tablets (5-10 mg total) by mouth 3 (three) times daily as needed for muscle spasms. (Patient not taking: Reported on 11/24/2015), Disp: 20 tablet, Rfl: 0 .  ergocalciferol (VITAMIN D2) 50000 units capsule, Take 1 capsule (50,000 Units total) by mouth once a week., Disp: 12 capsule, Rfl: 0 .   FeFum-FePoly-FA-B Cmp-C-Biot (INTEGRA PLUS) CAPS, Take 1 capsule by mouth daily. (Patient not taking: Reported on 11/24/2015), Disp: 90 capsule, Rfl: 3 .  ferrous sulfate 325 (65 FE) MG tablet, TAKE 1 TABLET (325 MG TOTAL) BY MOUTH 3 (THREE) TIMES DAILY WITH MEALS., Disp: , Rfl: 11 .  HYDROcodone-acetaminophen (NORCO) 5-325 MG tablet, Take 1 tablet by mouth every 6 (six) hours as needed for moderate pain. (Patient not taking: Reported on 11/24/2015), Disp: 20 tablet, Rfl: 0 .  ibuprofen (ADVIL,MOTRIN) 600 MG tablet, Take 1 tablet (600 mg total) by mouth every 6 (six) hours as needed for moderate pain. (Patient not taking: Reported on 11/24/2015), Disp: 30 tablet, Rfl: 0 .  loratadine (CLARITIN) 10 MG tablet, Take 1 tablet (10 mg total) by mouth daily. (Patient not taking: Reported on 11/24/2015), Disp: 30 tablet, Rfl: 11 .  meloxicam (MOBIC) 7.5 MG tablet, , Disp: , Rfl:  .  NONFORMULARY OR COMPOUNDED ITEM, Cbcd, ibc , ferritin dx iron def anemia, Disp: 1 each, Rfl: 0 .  omeprazole (PRILOSEC) 40 MG capsule, Take 1 capsule (40 mg total) by mouth daily., Disp: 90 capsule, Rfl: 3  Physical exam: There were no vitals filed for this visit. Physical Exam  Constitutional: She is oriented to person, place, and time and well-developed, well-nourished, and in no distress.  HENT:  Head: Normocephalic and atraumatic.  Eyes: EOM are normal. Pupils are equal, round, and reactive to Grismer.  Neck: Normal range of motion.  Cardiovascular: Normal rate, regular rhythm and normal heart sounds.   Pulmonary/Chest: Effort normal and breath sounds normal.  Abdominal: Soft. Bowel sounds are normal.  Neurological: She is alert and oriented to person, place, and time.  Skin: Skin is warm and dry.     CMP Latest Ref Rng & Units 04/13/2014  Glucose mg/dL 657(Q)  BUN mg/dL 9  Creatinine mg/dL 4.69  Sodium mmol/L 629  Potassium mmol/L 4.0  Chloride mmol/L 105  CO2 mmol/L 28  Calcium mg/dL 5.2(W)  Total Protein g/dL  -  Total Bilirubin mg/dL -  Alkaline Phos U/L -  AST U/L -  ALT U/L -   CBC Latest Ref Rng & Units 01/05/2016  WBC 3.6 - 11.0 K/uL 7.7  Hemoglobin 12.0 - 16.0 g/dL 41.3  Hematocrit 24.4 - 47.0 % 43.6  Platelets 150 - 440 K/uL 195    No images are attached to the encounter.  No results found.   Assessment and plan- Patient is a 46 y.o. female with a h/o iron deficiency anemia likely from gastric bypass     Visit Diagnosis No diagnosis found.   Dr. Owens Shark, MD, MPH [facility] Pager- [phone] 04/06/2016 10:16 AM

## 2016-05-08 ENCOUNTER — Inpatient Hospital Stay: Payer: Managed Care, Other (non HMO) | Attending: Oncology

## 2016-05-08 DIAGNOSIS — Z9884 Bariatric surgery status: Secondary | ICD-10-CM | POA: Diagnosis not present

## 2016-05-08 DIAGNOSIS — E559 Vitamin D deficiency, unspecified: Secondary | ICD-10-CM | POA: Insufficient documentation

## 2016-05-08 DIAGNOSIS — Z880 Allergy status to penicillin: Secondary | ICD-10-CM | POA: Insufficient documentation

## 2016-05-08 DIAGNOSIS — Z9049 Acquired absence of other specified parts of digestive tract: Secondary | ICD-10-CM | POA: Insufficient documentation

## 2016-05-08 DIAGNOSIS — D509 Iron deficiency anemia, unspecified: Secondary | ICD-10-CM

## 2016-05-08 DIAGNOSIS — Z79899 Other long term (current) drug therapy: Secondary | ICD-10-CM | POA: Insufficient documentation

## 2016-05-08 DIAGNOSIS — E538 Deficiency of other specified B group vitamins: Secondary | ICD-10-CM | POA: Diagnosis not present

## 2016-05-08 DIAGNOSIS — D508 Other iron deficiency anemias: Secondary | ICD-10-CM

## 2016-05-08 LAB — CBC WITH DIFFERENTIAL/PLATELET
BASOS PCT: 1 %
Basophils Absolute: 0 10*3/uL (ref 0–0.1)
EOS ABS: 0.1 10*3/uL (ref 0–0.7)
EOS PCT: 2 %
HEMATOCRIT: 40.4 % (ref 35.0–47.0)
HEMOGLOBIN: 13.4 g/dL (ref 12.0–16.0)
LYMPHS PCT: 22 %
Lymphs Abs: 1.2 10*3/uL (ref 1.0–3.6)
MCH: 26.3 pg (ref 26.0–34.0)
MCHC: 33.1 g/dL (ref 32.0–36.0)
MCV: 79.5 fL — AB (ref 80.0–100.0)
MONO ABS: 0.3 10*3/uL (ref 0.2–0.9)
Monocytes Relative: 6 %
NEUTROS ABS: 3.7 10*3/uL (ref 1.4–6.5)
Neutrophils Relative %: 69 %
Platelets: 249 10*3/uL (ref 150–440)
RBC: 5.09 MIL/uL (ref 3.80–5.20)
RDW: 14.8 % — AB (ref 11.5–14.5)
WBC: 5.4 10*3/uL (ref 3.6–11.0)

## 2016-05-08 LAB — RETICULOCYTES
RBC.: 5.17 MIL/uL (ref 3.80–5.20)
Retic Count, Absolute: 82.7 10*3/uL (ref 19.0–183.0)
Retic Ct Pct: 1.6 % (ref 0.4–3.1)

## 2016-05-08 LAB — FERRITIN: Ferritin: 6 ng/mL — ABNORMAL LOW (ref 11–307)

## 2016-05-09 ENCOUNTER — Inpatient Hospital Stay: Payer: Managed Care, Other (non HMO)

## 2016-05-09 LAB — VITAMIN D 25 HYDROXY (VIT D DEFICIENCY, FRACTURES): VIT D 25 HYDROXY: 31.3 ng/mL (ref 30.0–100.0)

## 2016-05-16 ENCOUNTER — Inpatient Hospital Stay (HOSPITAL_BASED_OUTPATIENT_CLINIC_OR_DEPARTMENT_OTHER): Payer: Managed Care, Other (non HMO) | Admitting: Oncology

## 2016-05-16 ENCOUNTER — Inpatient Hospital Stay: Payer: Managed Care, Other (non HMO)

## 2016-05-16 ENCOUNTER — Encounter: Payer: Self-pay | Admitting: Oncology

## 2016-05-16 VITALS — BP 113/76 | HR 80 | Resp 20

## 2016-05-16 VITALS — BP 110/68 | HR 78 | Temp 98.0°F | Resp 18 | Wt 251.4 lb

## 2016-05-16 DIAGNOSIS — D509 Iron deficiency anemia, unspecified: Secondary | ICD-10-CM

## 2016-05-16 DIAGNOSIS — Z880 Allergy status to penicillin: Secondary | ICD-10-CM

## 2016-05-16 DIAGNOSIS — E538 Deficiency of other specified B group vitamins: Secondary | ICD-10-CM

## 2016-05-16 DIAGNOSIS — Z79899 Other long term (current) drug therapy: Secondary | ICD-10-CM | POA: Diagnosis not present

## 2016-05-16 DIAGNOSIS — Z9884 Bariatric surgery status: Secondary | ICD-10-CM | POA: Diagnosis not present

## 2016-05-16 DIAGNOSIS — Z9049 Acquired absence of other specified parts of digestive tract: Secondary | ICD-10-CM | POA: Diagnosis not present

## 2016-05-16 DIAGNOSIS — D559 Anemia due to enzyme disorder, unspecified: Secondary | ICD-10-CM

## 2016-05-16 DIAGNOSIS — E559 Vitamin D deficiency, unspecified: Secondary | ICD-10-CM

## 2016-05-16 MED ORDER — CYANOCOBALAMIN 1000 MCG/ML IJ SOLN: 1000.0000 ug | INTRAMUSCULAR | 3 refills | Status: DC

## 2016-05-16 MED ORDER — ERGOCALCIFEROL 1.25 MG (50000 UT) PO CAPS: 50000.0000 [IU] | ORAL_CAPSULE | ORAL | 0 refills | Status: DC

## 2016-05-16 MED ORDER — SODIUM CHLORIDE 0.9 % IV SOLN
INTRAVENOUS | Status: DC
  Administered 2016-05-16: 14:00:00 via INTRAVENOUS
  Filled 2016-05-16: qty 1000

## 2016-05-16 MED ORDER — SODIUM CHLORIDE 0.9 % IV SOLN
510.0000 mg | Freq: Once | INTRAVENOUS | Status: AC
  Administered 2016-05-16: 510 mg via INTRAVENOUS
  Filled 2016-05-16: qty 17

## 2016-05-16 NOTE — Progress Notes (Signed)
Hematology/Oncology Consult note [facility]  [phone] [phone]  Patient Care Team: Zola Button, Grayling Congress, DO as PCP - General   Name of the patient: Erin Howe  [id number]  [birth year]   Date of visit: 05/16/16  Diagnosis- iron deficiency anemia  Chief complaint/ Reason for visit- routine f/u  Heme/Onc history: Patient is a 46 year old female who was previously seen by Dr. Bertis Ruddy for iron deficiency anemia. She had normal CBC up until 2011. She then began to have progressive microcytic anemia starting 2012. Her hemoglobin was 8 with an MCV of 64 in August 2017. Ferritin was low at 3. She also has B12 deficiency and self-administered B12 injections at home. No routine use of NSAIDs. She has been on oral iron in the past with no improvement in her anemia. She does have a history of gastric bypass surgery in the past. Last dose of IV iron was in December 2017.  Interval history- doing well. Denies any complaints   Review of systems- Review of Systems  Constitutional: Negative for chills, fever, malaise/fatigue and weight loss.  HENT: Negative for congestion, ear discharge and nosebleeds.   Eyes: Negative for blurred vision.  Respiratory: Negative for cough, hemoptysis, sputum production, shortness of breath and wheezing.   Cardiovascular: Negative for chest pain, palpitations, orthopnea and claudication.  Gastrointestinal: Negative for abdominal pain, blood in stool, constipation, diarrhea, heartburn, melena, nausea and vomiting.  Genitourinary: Negative for dysuria, flank pain, frequency, hematuria and urgency.  Musculoskeletal: Negative for back pain, joint pain and myalgias.  Skin: Negative for rash.  Neurological: Negative for dizziness, tingling, focal weakness, seizures, weakness and headaches.  Endo/Heme/Allergies: Does not bruise/bleed easily.  Psychiatric/Behavioral: Negative for depression and suicidal ideas. The patient  does not have insomnia.      Current treatment- feraheme  Allergies  Allergen Reactions  . Amoxicillin   . Penicillins   . Tetanus Toxoid      Past Medical History:  Diagnosis Date  . Anemia      Past Surgical History:  Procedure Laterality Date  . CHOLECYSTECTOMY    . GASTRIC BYPASS  02-03-08  . REFRACTIVE SURGERY  2006  . WISDOM TOOTH EXTRACTION      Social History   Social History  . Marital status: Married    Spouse name: N/A  . Number of children: N/A  . Years of education: N/A   Occupational History  . 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 Smoking status: Never Smoker  . Smokeless tobacco: Former Neurosurgeon  . Alcohol use No  . Drug use: No  . Sexual activity: Yes    Partners: Male   Other Topics Concern  . Not on file   Social History Narrative   Exercise-- walking, water aerobics    Family History  Problem Relation Age of Onset  . Atrial fibrillation Mother   . Heart failure Mother   . Deep vein thrombosis Mother   . Diabetes Mother   . Hyperlipidemia Mother   . Hypertension Mother   . Hyperlipidemia Father   . Hypertension Father   . Diabetes Sister   . Stroke Maternal Grandfather   . Heart disease Maternal Grandmother      Current Outpatient Prescriptions:  .  amphetamine-dextroamphetamine (ADDERALL XR) 30 MG 24 hr capsule, Take 1 capsule (30 mg total) by mouth every morning., Disp: 90 capsule, Rfl: 0 .  amphetamine-dextroamphetamine (ADDERALL XR) 30 MG 24 hr capsule, Take  by mouth., Disp: , Rfl:  .  amphetamine-dextroamphetamine (ADDERALL) 20 MG tablet, 1 po q afternoon, Disp: 90 tablet, Rfl: 0 .  cyanocobalamin (,VITAMIN B-12,) 1000 MCG/ML injection, Inject 1 mL (1,000 mcg total) into the skin every 30 (thirty) days. Weekly x4 then 1ml, Disp: 10 mL, Rfl: 3 .  cyclobenzaprine (FLEXERIL) 5 MG tablet, Take 1-2 tablets (5-10 mg total) by mouth 3 (three) times daily as needed for muscle spasms. (Patient not taking:  Reported on 11/24/2015), Disp: 20 tablet, Rfl: 0 .  ergocalciferol (VITAMIN D2) 50000 units capsule, Take 1 capsule (50,000 Units total) by mouth once a week., Disp: 12 capsule, Rfl: 0 .  FeFum-FePoly-FA-B Cmp-C-Biot (INTEGRA PLUS) CAPS, Take 1 capsule by mouth daily. (Patient not taking: Reported on 11/24/2015), Disp: 90 capsule, Rfl: 3 .  ferrous sulfate 325 (65 FE) MG tablet, TAKE 1 TABLET (325 MG TOTAL) BY MOUTH 3 (THREE) TIMES DAILY WITH MEALS., Disp: , Rfl: 11 .  HYDROcodone-acetaminophen (NORCO) 5-325 MG tablet, Take 1 tablet by mouth every 6 (six) hours as needed for moderate pain. (Patient not taking: Reported on 11/24/2015), Disp: 20 tablet, Rfl: 0 .  ibuprofen (ADVIL,MOTRIN) 600 MG tablet, Take 1 tablet (600 mg total) by mouth every 6 (six) hours as needed for moderate pain. (Patient not taking: Reported on 11/24/2015), Disp: 30 tablet, Rfl: 0 .  loratadine (CLARITIN) 10 MG tablet, Take 1 tablet (10 mg total) by mouth daily. (Patient not taking: Reported on 11/24/2015), Disp: 30 tablet, Rfl: 11 .  meloxicam (MOBIC) 7.5 MG tablet, , Disp: , Rfl:  .  NONFORMULARY OR COMPOUNDED ITEM, Cbcd, ibc , ferritin dx iron def anemia, Disp: 1 each, Rfl: 0 .  omeprazole (PRILOSEC) 40 MG capsule, Take 1 capsule (40 mg total) by mouth daily., Disp: 90 capsule, Rfl: 3  Physical exam:  Vitals:   05/16/16 1338  BP: 110/68  Pulse: 78  Resp: 18  Temp: 98 F (36.7 C)  TempSrc: Tympanic  Weight: 251 lb 6.4 oz (114 kg)   Physical Exam  Constitutional: She is oriented to person, place, and time.  Obese, appears in no acute distress  HENT:  Head: Normocephalic and atraumatic.  Eyes: EOM are normal. Pupils are equal, round, and reactive to Zaccaro.  Neck: Normal range of motion.  Cardiovascular: Normal rate, regular rhythm and normal heart sounds.   Pulmonary/Chest: Effort normal and breath sounds normal.  Abdominal: Soft. Bowel sounds are normal.  Neurological: She is alert and oriented to person,  place, and time.  Skin: Skin is warm and dry.     CMP Latest Ref Rng & Units 04/13/2014  Glucose mg/dL 161(W108(H)  BUN mg/dL 9  Creatinine mg/dL 9.600.62  Sodium mmol/L 454138  Potassium mmol/L 4.0  Chloride mmol/L 105  CO2 mmol/L 28  Calcium mg/dL 0.9(W8.5(L)  Total Protein g/dL -  Total Bilirubin mg/dL -  Alkaline Phos U/L -  AST U/L -  ALT U/L -   CBC Latest Ref Rng & Units 05/08/2016  WBC 3.6 - 11.0 K/uL 5.4  Hemoglobin 12.0 - 16.0 g/dL 11.913.4  Hematocrit 14.735.0 - 47.0 % 40.4  Platelets 150 - 440 K/uL 249   Recent ferritin from 05/08/2016 was 6.  Assessment and plan- Patient is a 46 y.o. female with iron deficiency and B12 deficiency likely secondary to gastric bypass  Review results of recent blood work from 05/08/2016 which showed that her anemia has resolved with an H&H of 13.4/40.4. However her ferritin is still low at 6. Her last  dose of IV iron and was in December 2017. I will go ahead with 2 more doses of ferriheme at this time given that she still microcytic and may become overtly anemic if IV iron is not given at this time  Discussed referral to GI to r/o if she needs EGD or colonoscopy at this time given her ongoing iron deficiency although it could be very well from gastric bypass surgery. Patient would like to hold off for now  Vit D deficiency- continue weekly 50000 units. Repeat levels in 6 months  B12 deficiency- continue home monthly b12 injections. Prescription refilled   Visit Diagnosis 1. Iron deficiency anemia, unspecified iron deficiency anemia type   2. Vitamin D deficiency   3. B12 deficiency      Dr. Owens Shark, MD, MPH [facility] Pager- [phone] 05/16/2016 1:52 PM

## 2016-05-16 NOTE — Addendum Note (Signed)
Addended by: Corene CorneaVENABLE, Pride Gonzales Y on: 05/16/2016 02:47 PM   Modules accepted: Orders

## 2016-05-16 NOTE — Progress Notes (Signed)
Here for follow up appt and per pt feeling groggy

## 2016-05-24 ENCOUNTER — Ambulatory Visit
Admission: RE | Admit: 2016-05-24 | Discharge: 2016-05-24 | Disposition: A | Discharge status: Home / Self Care | Payer: Managed Care, Other (non HMO) | Source: Ambulatory Visit | Attending: Family Medicine | Admitting: Family Medicine

## 2016-05-24 ENCOUNTER — Inpatient Hospital Stay: Payer: Managed Care, Other (non HMO)

## 2016-05-24 VITALS — BP 120/82 | HR 82 | Temp 97.5°F | Resp 18

## 2016-05-24 DIAGNOSIS — Z1231 Encounter for screening mammogram for malignant neoplasm of breast: Secondary | ICD-10-CM | POA: Insufficient documentation

## 2016-05-24 DIAGNOSIS — D509 Iron deficiency anemia, unspecified: Secondary | ICD-10-CM

## 2016-05-24 MED ORDER — SODIUM CHLORIDE 0.9 % IV SOLN
Freq: Once | INTRAVENOUS | Status: AC
  Administered 2016-05-24: 14:00:00 via INTRAVENOUS
  Filled 2016-05-24: qty 1000

## 2016-05-24 MED ORDER — SODIUM CHLORIDE 0.9 % IV SOLN
510.0000 mg | Freq: Once | INTRAVENOUS | Status: AC
  Administered 2016-05-24: 510 mg via INTRAVENOUS
  Filled 2016-05-24: qty 17

## 2016-08-06 ENCOUNTER — Other Ambulatory Visit: Payer: Self-pay | Admitting: Oncology

## 2016-10-31 ENCOUNTER — Other Ambulatory Visit: Payer: Self-pay | Admitting: Oncology

## 2016-11-10 ENCOUNTER — Inpatient Hospital Stay: Payer: 59 | Attending: Oncology

## 2016-11-10 DIAGNOSIS — D509 Iron deficiency anemia, unspecified: Secondary | ICD-10-CM | POA: Insufficient documentation

## 2016-11-10 DIAGNOSIS — Z803 Family history of malignant neoplasm of breast: Secondary | ICD-10-CM | POA: Insufficient documentation

## 2016-11-10 DIAGNOSIS — E538 Deficiency of other specified B group vitamins: Secondary | ICD-10-CM | POA: Insufficient documentation

## 2016-11-10 DIAGNOSIS — R05 Cough: Secondary | ICD-10-CM | POA: Insufficient documentation

## 2016-11-10 DIAGNOSIS — E669 Obesity, unspecified: Secondary | ICD-10-CM | POA: Insufficient documentation

## 2016-11-10 DIAGNOSIS — Z9884 Bariatric surgery status: Secondary | ICD-10-CM | POA: Insufficient documentation

## 2016-11-10 DIAGNOSIS — Z79899 Other long term (current) drug therapy: Secondary | ICD-10-CM | POA: Insufficient documentation

## 2016-11-10 DIAGNOSIS — Z880 Allergy status to penicillin: Secondary | ICD-10-CM | POA: Insufficient documentation

## 2016-11-16 ENCOUNTER — Inpatient Hospital Stay: Payer: 59 | Admitting: Oncology

## 2016-11-22 ENCOUNTER — Inpatient Hospital Stay: Payer: 59

## 2016-11-22 DIAGNOSIS — Z79899 Other long term (current) drug therapy: Secondary | ICD-10-CM | POA: Diagnosis not present

## 2016-11-22 DIAGNOSIS — D509 Iron deficiency anemia, unspecified: Secondary | ICD-10-CM | POA: Diagnosis not present

## 2016-11-22 DIAGNOSIS — E559 Vitamin D deficiency, unspecified: Secondary | ICD-10-CM

## 2016-11-22 DIAGNOSIS — E538 Deficiency of other specified B group vitamins: Secondary | ICD-10-CM

## 2016-11-22 DIAGNOSIS — Z880 Allergy status to penicillin: Secondary | ICD-10-CM | POA: Diagnosis not present

## 2016-11-22 DIAGNOSIS — E669 Obesity, unspecified: Secondary | ICD-10-CM | POA: Diagnosis not present

## 2016-11-22 DIAGNOSIS — Z9884 Bariatric surgery status: Secondary | ICD-10-CM | POA: Diagnosis not present

## 2016-11-22 DIAGNOSIS — R05 Cough: Secondary | ICD-10-CM | POA: Diagnosis not present

## 2016-11-22 DIAGNOSIS — Z803 Family history of malignant neoplasm of breast: Secondary | ICD-10-CM | POA: Diagnosis not present

## 2016-11-22 LAB — CBC
HCT: 44.9 % (ref 35.0–47.0)
Hemoglobin: 15.1 g/dL (ref 12.0–16.0)
MCH: 29.3 pg (ref 26.0–34.0)
MCHC: 33.6 g/dL (ref 32.0–36.0)
MCV: 87.2 fL (ref 80.0–100.0)
PLATELETS: 218 10*3/uL (ref 150–440)
RBC: 5.15 MIL/uL (ref 3.80–5.20)
RDW: 14.2 % (ref 11.5–14.5)
WBC: 7.9 10*3/uL (ref 3.6–11.0)

## 2016-11-22 LAB — FERRITIN: Ferritin: 19 ng/mL (ref 11–307)

## 2016-11-22 LAB — IRON AND TIBC
IRON: 55 ug/dL (ref 28–170)
Saturation Ratios: 19 % (ref 10.4–31.8)
TIBC: 293 ug/dL (ref 250–450)
UIBC: 238 ug/dL

## 2016-11-23 LAB — VITAMIN D 25 HYDROXY (VIT D DEFICIENCY, FRACTURES): VIT D 25 HYDROXY: 19.2 ng/mL — AB (ref 30.0–100.0)

## 2016-11-29 ENCOUNTER — Other Ambulatory Visit: Payer: Self-pay | Admitting: *Deleted

## 2016-11-29 DIAGNOSIS — E559 Vitamin D deficiency, unspecified: Secondary | ICD-10-CM

## 2016-11-29 DIAGNOSIS — D509 Iron deficiency anemia, unspecified: Secondary | ICD-10-CM

## 2016-11-30 ENCOUNTER — Encounter: Payer: Self-pay | Admitting: Oncology

## 2016-11-30 ENCOUNTER — Telehealth: Payer: Self-pay | Admitting: *Deleted

## 2016-11-30 ENCOUNTER — Inpatient Hospital Stay (HOSPITAL_BASED_OUTPATIENT_CLINIC_OR_DEPARTMENT_OTHER): Payer: 59 | Admitting: Oncology

## 2016-11-30 VITALS — BP 133/86 | HR 79 | Temp 97.9°F | Resp 18 | Wt 246.0 lb

## 2016-11-30 DIAGNOSIS — E538 Deficiency of other specified B group vitamins: Secondary | ICD-10-CM

## 2016-11-30 DIAGNOSIS — E669 Obesity, unspecified: Secondary | ICD-10-CM

## 2016-11-30 DIAGNOSIS — Z79899 Other long term (current) drug therapy: Secondary | ICD-10-CM

## 2016-11-30 DIAGNOSIS — R05 Cough: Secondary | ICD-10-CM | POA: Diagnosis not present

## 2016-11-30 DIAGNOSIS — Z880 Allergy status to penicillin: Secondary | ICD-10-CM | POA: Diagnosis not present

## 2016-11-30 DIAGNOSIS — Z803 Family history of malignant neoplasm of breast: Secondary | ICD-10-CM

## 2016-11-30 DIAGNOSIS — Z9884 Bariatric surgery status: Secondary | ICD-10-CM

## 2016-11-30 DIAGNOSIS — D509 Iron deficiency anemia, unspecified: Secondary | ICD-10-CM | POA: Diagnosis not present

## 2016-11-30 DIAGNOSIS — R790 Abnormal level of blood mineral: Secondary | ICD-10-CM

## 2016-11-30 NOTE — Progress Notes (Signed)
Patient here for follow up with labs today. Patient states that she has had bronchitis for several months. She went to Urgent Care about a week ago and got an antibiotic. She states that she has been coughing for several months and nothing has helped. She is requesting cough medicine today.

## 2016-11-30 NOTE — Telephone Encounter (Signed)
Called patient's PCP Sionne George,[place] MD's office, per Dr. Assunta Gamblesao's request for them to take over her low vitamin D level.  Faxed note and results to PCP's office.     dhs

## 2016-11-30 NOTE — Progress Notes (Signed)
Hematology/Oncology Consult note [facility]  [phone]) [phone] [phone]  Patient Care Team: Zola ButtonLowne Chase, Grayling CongressYvonne R, DO as PCP - General   Name of the patient: Erin Howe  [id number]  06/04/1970   Date of visit: 11/30/16  Diagnosis- iron deficiency anemia  Chief complaint/ Reason for visit- routine f/u of iron and b12 deficiency anemia  Heme/Onc history: Patient is a 46 year old female who was previously seen by Dr. Bertis RuddyGorsuch for iron deficiency anemia. She had normal CBC up until 2011. She then began to have progressive microcytic anemia starting 2012. Her hemoglobin was 8 with an MCV of 64 in August 2017. Ferritin was low at 3. She also has B12 deficiency and self-administered B12 injections at home. No routine use of NSAIDs. She has been on oral iron in the past with no improvement in her anemia. She does have a history of gastric bypass surgery in the past. Last dose of IV iron was in December 2017.  Interval history-patient reports that she has had chronic cough for the last 3 months and she went to the urgent care for the same and received antibiotics.  Her cough has however not gotten better.  She denies any productive sputum production or fever.  ECOG PS- 1 Pain scale- 0   Review of systems- Review of Systems  Constitutional: Negative for chills, fever, malaise/fatigue and weight loss.  HENT: Negative for congestion, ear discharge and nosebleeds.   Eyes: Negative for blurred vision.  Respiratory: Positive for cough. Negative for hemoptysis, sputum production, shortness of breath and wheezing.   Cardiovascular: Negative for chest pain, palpitations, orthopnea and claudication.  Gastrointestinal: Negative for abdominal pain, blood in stool, constipation, diarrhea, heartburn, melena, nausea and vomiting.  Genitourinary: Negative for dysuria, flank pain, frequency, hematuria and urgency.  Musculoskeletal: Negative for back pain, joint  pain and myalgias.  Skin: Negative for rash.  Neurological: Negative for dizziness, tingling, focal weakness, seizures, weakness and headaches.  Endo/Heme/Allergies: Does not bruise/bleed easily.  Psychiatric/Behavioral: Negative for depression and suicidal ideas. The patient does not have insomnia.        Allergies  Allergen Reactions  . Amoxicillin   . Penicillins Swelling  . Tetanus Toxoid   . Tetanus Toxoids      Past Medical History:  Diagnosis Date  . Anemia      Past Surgical History:  Procedure Laterality Date  . CHOLECYSTECTOMY    . GASTRIC BYPASS  02-03-08  . REFRACTIVE SURGERY  2006  . WISDOM TOOTH EXTRACTION      Social History   Socioeconomic History  . Marital status: Married    Spouse name: Not on file  . Number of children: Not on file  . Years of education: Not on file  . Highest education level: Not on file  Social Needs  . Financial resource strain: Not on file  . Food insecurity - worry: Not on file  . Food insecurity - inability: Not on file  . Transportation needs - medical: Not on file  . Transportation needs - non-medical: Not on file  Occupational History  . 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 Smoking status: Never Smoker  . Smokeless tobacco: Former Engineer, waterUser  Substance and Sexual Activity  . Alcohol use: No  . Drug use: No  . Sexual activity: Yes    Partners: Male  Other Topics Concern  . Not on file  Social History Narrative   Exercise-- walking, water aerobics  Family History  Problem Relation Age of Onset  . Atrial fibrillation Mother   . Heart failure Mother   . Deep vein thrombosis Mother   . Diabetes Mother   . Hyperlipidemia Mother   . Hypertension Mother   . Hyperlipidemia Father   . Hypertension Father   . Diabetes Sister   . Stroke Maternal Grandfather   . Heart disease Maternal Grandmother   . Breast cancer Cousin      Current Outpatient Medications:  .  amphetamine-dextroamphetamine  (ADDERALL) 20 MG tablet, 1 po q afternoon, Disp: 90 tablet, Rfl: 0 .  clonazePAM (KLONOPIN) 0.5 MG tablet, TAKE 1 TABLET BY MOUTH NIGHTLY AS NEEDED FOR ANXIETY, Disp: , Rfl: 2 .  cyanocobalamin (,VITAMIN B-12,) 1000 MCG/ML injection, Inject 1 mL (1,000 mcg total) into the skin every 30 (thirty) days., Disp: 3 mL, Rfl: 3 .  cyclobenzaprine (FLEXERIL) 5 MG tablet, Take 1-2 tablets (5-10 mg total) by mouth 3 (three) times daily as needed for muscle spasms., Disp: 20 tablet, Rfl: 0 .  loratadine (CLARITIN) 10 MG tablet, Take 1 tablet (10 mg total) by mouth daily., Disp: 30 tablet, Rfl: 11 .  meloxicam (MOBIC) 7.5 MG tablet, , Disp: , Rfl:  .  omeprazole (PRILOSEC) 40 MG capsule, Take 1 capsule (40 mg total) by mouth daily., Disp: 90 capsule, Rfl: 3 .  Vitamin D, Ergocalciferol, (DRISDOL) 50000 units CAPS capsule, TAKE ONE CAPSULE BY MOUTH WEEKLY AS ONE DOSE, Disp: 12 capsule, Rfl: 0 .  amphetamine-dextroamphetamine (ADDERALL XR) 30 MG 24 hr capsule, Take by mouth., Disp: , Rfl:   Physical exam:  Vitals:   11/30/16 1446  BP: 133/86  Pulse: 79  Resp: 18  Temp: 97.9 F (36.6 C)  TempSrc: Tympanic  Weight: 246 lb (111.6 kg)   Physical Exam  Constitutional: She is oriented to person, place, and time.  Patient is obese.  Does not appear to be in any acute distress  HENT:  Head: Normocephalic and atraumatic.  Eyes: EOM are normal. Pupils are equal, round, and reactive to Gail.  Neck: Normal range of motion.  Cardiovascular: Normal rate, regular rhythm and normal heart sounds.  Pulmonary/Chest: Effort normal and breath sounds normal.  Abdominal: Soft. Bowel sounds are normal.  Neurological: She is alert and oriented to person, place, and time.  Skin: Skin is warm and dry.     CMP Latest Ref Rng & Units 04/13/2014  Glucose mg/dL 295(A108(H)  BUN mg/dL 9  Creatinine mg/dL 2.130.62  Sodium mmol/L 086138  Potassium mmol/L 4.0  Chloride mmol/L 105  CO2 mmol/L 28  Calcium mg/dL 5.7(Q8.5(L)  Total Protein  g/dL -  Total Bilirubin mg/dL -  Alkaline Phos U/L -  AST U/L -  ALT U/L -   CBC Latest Ref Rng & Units 11/22/2016  WBC 3.6 - 11.0 K/uL 7.9  Hemoglobin 12.0 - 16.0 g/dL 46.915.1  Hematocrit 62.935.0 - 47.0 % 44.9  Platelets 150 - 440 K/uL 218     Assessment and plan- Patient is a 46 y.o. female with iron and B12 deficiency likely secondary to gastric bypass  Based on recent blood work on 11/22/2016 patient is not anemic and her hemoglobin is 15.1/44.9.  However her ferritin was still low at 19.  I will therefore proceed with 2 more doses of Feraheme at this time.  Patient will also continue to take monthly B12 injections.  I have again encouraged the patient to see GI to rule out other cause of iron deficiency given her  continued low ferritin  Last vitamin D level checked on 11/22/2016 was again low at 19.2.  We will touch base with her primary care doctor to kindly manage her vitamin D deficiency at this time  Repeat CBC ferritin and iron studies and B12 in 3 and 6 months and I will see her back in 6 months  With regards to her cough I have asked the patient to get in touch with her primary care doctor regarding this  Visit Diagnosis 1. H/O gastric bypass   2. Low ferritin      Dr. Owens Shark, MD, MPH [facility] Pager- [phone] 12/04/2016 9:11 AM

## 2016-12-07 ENCOUNTER — Inpatient Hospital Stay: Payer: 59 | Attending: Oncology

## 2016-12-07 DIAGNOSIS — Z9884 Bariatric surgery status: Secondary | ICD-10-CM | POA: Insufficient documentation

## 2016-12-07 DIAGNOSIS — D509 Iron deficiency anemia, unspecified: Secondary | ICD-10-CM | POA: Diagnosis present

## 2016-12-07 DIAGNOSIS — D538 Other specified nutritional anemias: Secondary | ICD-10-CM | POA: Insufficient documentation

## 2016-12-07 MED ORDER — SODIUM CHLORIDE 0.9 % IV SOLN
510.0000 mg | Freq: Once | INTRAVENOUS | Status: AC
  Administered 2016-12-07: 510 mg via INTRAVENOUS
  Filled 2016-12-07: qty 17

## 2016-12-07 MED ORDER — SODIUM CHLORIDE 0.9 % IV SOLN
Freq: Once | INTRAVENOUS | Status: AC
  Administered 2016-12-07: 15:00:00 via INTRAVENOUS
  Filled 2016-12-07: qty 1000

## 2016-12-13 ENCOUNTER — Telehealth: Payer: Self-pay | Admitting: Oncology

## 2016-12-13 ENCOUNTER — Emergency Department
Admission: EM | Admit: 2016-12-13 | Discharge: 2016-12-14 | Disposition: A | Discharge status: Home / Self Care | Payer: 59 | Attending: Student in an Organized Health Care Education/Training Program | Admitting: Student in an Organized Health Care Education/Training Program

## 2016-12-13 ENCOUNTER — Telehealth: Payer: Self-pay | Admitting: *Deleted

## 2016-12-13 ENCOUNTER — Encounter: Payer: Self-pay | Admitting: Emergency Medicine

## 2016-12-13 ENCOUNTER — Emergency Department: Payer: 59

## 2016-12-13 DIAGNOSIS — S4992XA Unspecified injury of left shoulder and upper arm, initial encounter: Secondary | ICD-10-CM | POA: Diagnosis present

## 2016-12-13 DIAGNOSIS — Y929 Unspecified place or not applicable: Secondary | ICD-10-CM | POA: Diagnosis not present

## 2016-12-13 DIAGNOSIS — Y999 Unspecified external cause status: Secondary | ICD-10-CM | POA: Insufficient documentation

## 2016-12-13 DIAGNOSIS — Z79899 Other long term (current) drug therapy: Secondary | ICD-10-CM | POA: Insufficient documentation

## 2016-12-13 DIAGNOSIS — S42225A 2-part nondisplaced fracture of surgical neck of left humerus, initial encounter for closed fracture: Secondary | ICD-10-CM | POA: Diagnosis not present

## 2016-12-13 DIAGNOSIS — Y939 Activity, unspecified: Secondary | ICD-10-CM | POA: Diagnosis not present

## 2016-12-13 DIAGNOSIS — W009XXA Unspecified fall due to ice and snow, initial encounter: Secondary | ICD-10-CM | POA: Diagnosis not present

## 2016-12-13 MED ORDER — OXYCODONE-ACETAMINOPHEN 5-325 MG PO TABS: 1.0000 | ORAL_TABLET | Freq: Four times a day (QID) | ORAL | 0 refills | Status: DC | PRN

## 2016-12-13 MED ORDER — HYDROMORPHONE HCL 1 MG/ML IJ SOLN
1.0000 mg | Freq: Once | INTRAMUSCULAR | Status: AC
  Administered 2016-12-13: 1 mg via INTRAMUSCULAR
  Filled 2016-12-13: qty 1

## 2016-12-13 MED ORDER — OXYCODONE-ACETAMINOPHEN 5-325 MG PO TABS
1.0000 | ORAL_TABLET | Freq: Once | ORAL | Status: AC
  Administered 2016-12-13: 1 via ORAL
  Filled 2016-12-13: qty 1

## 2016-12-13 NOTE — Telephone Encounter (Signed)
Called pt and asked her to see if she would be ok with r/s feraheme tom to the next week. The request is because of weather issues this week and some of the chemo pt's did not get treatment and we are trying to accommodate them in the later part of this week.  Patient forgot about the appt for tom. And she is fine to r/s and we mad eher new appt 12/19 at 2 pm and pt is agreeable

## 2016-12-13 NOTE — ED Notes (Signed)
Pt states fell on ice hitting elbow, unable to lift arm but can wiggle fingers and hand. Denies any numbness or tingling.

## 2016-12-13 NOTE — Telephone Encounter (Deleted)
Feraheme, rschd and conf with patient, per Inclement weather. MF

## 2016-12-13 NOTE — ED Provider Notes (Signed)
[facility] Emergency Department Provider Note  ____________________________________________  Time seen: Approximately 11:26 PM  I have reviewed the triage vital signs and the nursing notes.   HISTORY  Chief Complaint Arm Pain    HPI Erin Howe is a 46 y.o. female who presents emergency department complaining of left upper arm pain.  Patient reports that she was walking on black ice, slipped, fell.  Patient reports that she believes she tried to catch herself on a nearby car.  She reports that it happened so fast she is not exactly sure how she landed on her upper extremity.  Patient has no range of motion to the left shoulder.  Patient reports that the pain is from the mid humerus to the proximal humerus region.  Patient denies any numbness or tingling in her arm or hand.  She did not hit her head or lose consciousness during the fall.  Patient denies any other pain complaints other than left shoulder pain.  Patient is able to move wrist and all digits of the left hand appropriately.  No medications prior to arrival.  No other complaints at this time  Past Medical History:  Diagnosis Date  . Anemia     Patient Active Problem List   Diagnosis Date Noted  . IDA (iron deficiency anemia) 11/24/2015  . Iron deficiency anemia 11/24/2015  . Vitamin D deficiency 11/24/2015  . Severe obesity (BMI >= 40) (HCC) 10/08/2012  . Insomnia due to anxiety and fear 10/08/2012  . ADD (attention deficit disorder) 08/23/2010  . Bariatric surgery status 11/25/2008  . UNSPECIFIED ANEMIA 03/07/2007  . ALLERGIC RHINITIS 11/12/2006  . COUGH 11/12/2006  . UTI'S, RECURRENT 05/16/2006    Past Surgical History:  Procedure Laterality Date  . CHOLECYSTECTOMY    . GASTRIC BYPASS  02-03-08  . REFRACTIVE SURGERY  2006  . WISDOM TOOTH EXTRACTION      Prior to Admission medications   Medication Sig Start Date End Date Taking? Authorizing Provider  amphetamine-dextroamphetamine  (ADDERALL XR) 30 MG 24 hr capsule Take by mouth. 10/25/15 02/26/16  [provider]  amphetamine-dextroamphetamine (ADDERALL) 20 MG tablet 1 po q afternoon 06/09/14   Zola ButtonLowne Chase, Grayling CongressYvonne R, DO  clonazePAM (KLONOPIN) 0.5 MG tablet TAKE 1 TABLET BY MOUTH NIGHTLY AS NEEDED FOR ANXIETY 05/03/16   [provider]  cyanocobalamin (,VITAMIN B-12,) 1000 MCG/ML injection Inject 1 mL (1,000 mcg total) into the skin every 30 (thirty) days. 05/16/16   Creig Hinesao, Archana C, MD  cyclobenzaprine (FLEXERIL) 5 MG tablet Take 1-2 tablets (5-10 mg total) by mouth 3 (three) times daily as needed for muscle spasms. 10/22/15   Evon SlackGaines, Thomas C, PA-C  loratadine (CLARITIN) 10 MG tablet Take 1 tablet (10 mg total) by mouth daily. 04/08/12   Donato SchultzLowne Chase, Yvonne R, DO  meloxicam (MOBIC) 7.5 MG tablet  11/02/15   [provider]  omeprazole (PRILOSEC) 40 MG capsule Take 1 capsule (40 mg total) by mouth daily. 01/16/14   Donato SchultzLowne Chase, Yvonne R, DO  oxyCODONE-acetaminophen (ROXICET) 5-325 MG tablet Take 1 tablet by mouth every 6 (six) hours as needed for severe pain. 12/13/16   Braulio Kiedrowski, Delorise RoyalsJonathan D, PA-C  Vitamin D, Ergocalciferol, (DRISDOL) 50000 units CAPS capsule TAKE ONE CAPSULE BY MOUTH WEEKLY AS ONE DOSE 10/31/16   Creig Hinesao, Archana C, MD    Allergies Amoxicillin; Penicillins; Tetanus toxoid; and Tetanus toxoids  Family History  Problem Relation Age of Onset  . Atrial fibrillation Mother   . Heart failure Mother   .  Deep vein thrombosis Mother   . Diabetes Mother   . Hyperlipidemia Mother   . Hypertension Mother   . Hyperlipidemia Father   . Hypertension Father   . Diabetes Sister   . Stroke Maternal Grandfather   . Heart disease Maternal Grandmother   . Breast cancer Cousin     Social History Social History   Tobacco Use  . Smoking status: Never Smoker  . Smokeless tobacco: Former Engineer, waterUser  Substance Use Topics  . Alcohol use: No  . Drug use: No     Review of Systems  Constitutional: No  fever/chills Eyes: No visual changes.  Cardiovascular: no chest pain. Respiratory: no cough. No SOB. Gastrointestinal: No abdominal pain.  No nausea, no vomiting.  Musculoskeletal: Positive for left shoulder pain/left upper arm pain Skin: Negative for rash, abrasions, lacerations, ecchymosis. Neurological: Negative for headaches, focal weakness or numbness. 10-point ROS otherwise negative.  ____________________________________________   PHYSICAL EXAM:  VITAL SIGNS: ED Triage Vitals [12/13/16 2159]  Enc Vitals Group     BP 130/82     Pulse Rate 82     Resp 17     Temp 98.1 F (36.7 C)     Temp Source Oral     SpO2 97 %     Weight 246 lb (111.6 kg)     Height 5\' 6"  (1.676 m)     Head Circumference      Peak Flow      Pain Score 7     Pain Loc      Pain Edu?      Excl. in GC?      Constitutional: Alert and oriented. Well appearing and in no acute distress. Eyes: Conjunctivae are normal. PERRL. EOMI. Head: Atraumatic. Neck: No stridor.  No cervical spine tenderness to palpation.  Cardiovascular: Normal rate, regular rhythm. Normal S1 and S2.  Good peripheral circulation. Respiratory: Normal respiratory effort without tachypnea or retractions. Lungs CTAB. Good air entry to the bases with no decreased or absent breath sounds. Musculoskeletal: Limited range of motion to the left shoulder on inspection.  Patient is very tender to palpation over the proximal humerus both anteriorly, laterally, dorsally.  No palpable abnormality.  Patient is nontender to palpation over the clavicle, scapula, acromion process.  Patient is nontender to palpation over mid to distal humerus.  No tenderness to palpation over the osseous structures of the elbow.  Good flexion, extension, supination and pronation of the elbow.  Radial pulse intact distally.  Sensation intact all 5 digits. Neurologic:  Normal speech and language. No gross focal neurologic deficits are appreciated.  Skin:  Skin is warm, dry  and intact. No rash noted. Psychiatric: Mood and affect are normal. Speech and behavior are normal. Patient exhibits appropriate insight and judgement.   ____________________________________________   LABS (all labs ordered are listed, but only abnormal results are displayed)  Labs Reviewed - No data to display ____________________________________________  EKG   ____________________________________________  RADIOLOGY Festus BarrenI, Ruhi Kopke D Mase Dhondt, personally viewed and evaluated these images (plain radiographs) as part of my medical decision making, as well as reviewing the written report by the radiologist.  Dg Humerus Left  Result Date: 12/13/2016 CLINICAL DATA:  Left arm pain after slip on ice. EXAM: LEFT HUMERUS - 2+ VIEW COMPARISON:  None. FINDINGS: A comminuted closed fracture of the proximal humerus is noted involving the surgical neck and greater tuberosity. No significant displacement or angulation is identified. No joint dislocation is seen. IMPRESSION: Comminuted closed fracture of the  proximal humerus involving the surgical neck and greater tuberosity without significant displacement greater than 1 cm or angulation greater than 45 degrees. Findings would be in keeping with a Neer category 1 part fracture. Electronically Signed   By: Tollie Eth M.D.   On: 12/13/2016 23:04    ____________________________________________    PROCEDURES  Procedure(s) performed:    Procedures    Medications  HYDROmorphone (DILAUDID) injection 1 mg (1 mg Intramuscular Given 12/13/16 2331)  oxyCODONE-acetaminophen (PERCOCET/ROXICET) 5-325 MG per tablet 1 tablet (1 tablet Oral Given 12/13/16 2331)     ____________________________________________   INITIAL IMPRESSION / ASSESSMENT AND PLAN / ED COURSE  Pertinent labs & imaging results that were available during my care of the patient were reviewed by me and considered in my medical decision making (see chart for details).  Review of  the [place] CSRS was performed in accordance of the [facility] prior to dispensing any controlled drugs.     Patient's diagnosis is consistent with surgical neck fracture of the left humerus.  Patient has a nondisplaced denuded humerus fracture with fracture into the greater tuberosity.  No displacement.  No dislocation.  Initial differential included contusion versus rotator cuff tear versus biceps tear versus fracture versus dislocation.  The patient's shoulder is immobilized with a shoulder immobilizer.  Patient will be discharged home with prescriptions for Percocet for pain.  Patient is to follow-up with orthopedics for further management of fracture..  Patient is given ED precautions to return to the ED for any worsening or new symptoms.     ____________________________________________  FINAL CLINICAL IMPRESSION(S) / ED DIAGNOSES  Final diagnoses:  Closed 2-part nondisplaced fracture of surgical neck of left humerus, initial encounter      NEW MEDICATIONS STARTED DURING THIS VISIT:  ED Discharge Orders        Ordered    oxyCODONE-acetaminophen (ROXICET) 5-325 MG tablet  Every 6 hours PRN     12/13/16 2344          This chart was dictated using voice recognition software/Dragon. Despite best efforts to proofread, errors can occur which can change the meaning. Any change was purely unintentional.    Racheal Patches, PA-C 12/13/16 2347    Sharman Cheek, MD 12/19/16 [phone]

## 2016-12-13 NOTE — ED Notes (Addendum)
Shoulder immobilizer applied per PA Cuthriell

## 2016-12-13 NOTE — ED Triage Notes (Signed)
Pt comes into the ED via POV c/o left arm pain after slipping on ice and falling on the arm.  Limited ROM occurring at this time.  No deformity seen and patient is still able to wiggle fingers.  Patient in NAD and is ambulatory into triage at this time.

## 2016-12-14 ENCOUNTER — Inpatient Hospital Stay: Payer: 59

## 2016-12-20 ENCOUNTER — Inpatient Hospital Stay: Payer: 59

## 2016-12-21 ENCOUNTER — Telehealth: Payer: Self-pay | Admitting: *Deleted

## 2016-12-21 NOTE — Telephone Encounter (Signed)
Pt came into today to get her Venofer.  At the front desk she was told that her appointment was not today it was yesterday.  Patient was sent here to speak to me about this.  I told the patient that I had spoke with her personally on the phone and told her that the date was December 19 on a Wednesday at 2 PM and she was agreeable on the telephone about this.  Patient said she must of been confused because if it was a Wednesday she could not have made it and she would have told me no.  Patient was very tearful ,had a bad day and has a sling on her arm.  I called infusion they do not have any openings today I have rescheduled her for December 28 at 11 AM.  She has an appointment paper in her hand with the correct date and time.  And for her troubles I gave her a $10 gas vouchers for [employer].

## 2016-12-29 ENCOUNTER — Inpatient Hospital Stay: Payer: 59

## 2016-12-29 VITALS — BP 123/83 | HR 75 | Temp 97.7°F | Resp 18

## 2016-12-29 DIAGNOSIS — D509 Iron deficiency anemia, unspecified: Secondary | ICD-10-CM

## 2016-12-29 MED ORDER — SODIUM CHLORIDE 0.9 % IV SOLN
510.0000 mg | Freq: Once | INTRAVENOUS | Status: AC
  Administered 2016-12-29: 510 mg via INTRAVENOUS
  Filled 2016-12-29 (×3): qty 17

## 2016-12-29 MED ORDER — SODIUM CHLORIDE 0.9 % IV SOLN
Freq: Once | INTRAVENOUS | Status: AC
  Administered 2016-12-29: 11:00:00 via INTRAVENOUS
  Filled 2016-12-29: qty 1000

## 2017-01-03 DIAGNOSIS — M25512 Pain in left shoulder: Secondary | ICD-10-CM | POA: Diagnosis not present

## 2017-01-05 DIAGNOSIS — M25512 Pain in left shoulder: Secondary | ICD-10-CM | POA: Diagnosis not present

## 2017-01-07 DIAGNOSIS — J0100 Acute maxillary sinusitis, unspecified: Secondary | ICD-10-CM | POA: Diagnosis not present

## 2017-01-08 DIAGNOSIS — M25512 Pain in left shoulder: Secondary | ICD-10-CM | POA: Diagnosis not present

## 2017-01-11 DIAGNOSIS — M25512 Pain in left shoulder: Secondary | ICD-10-CM | POA: Diagnosis not present

## 2017-01-11 DIAGNOSIS — S42202D Unspecified fracture of upper end of left humerus, subsequent encounter for fracture with routine healing: Secondary | ICD-10-CM | POA: Diagnosis not present

## 2017-01-16 DIAGNOSIS — M25612 Stiffness of left shoulder, not elsewhere classified: Secondary | ICD-10-CM | POA: Diagnosis not present

## 2017-01-16 DIAGNOSIS — M67824 Other specified disorders of tendon, left elbow: Secondary | ICD-10-CM | POA: Diagnosis not present

## 2017-01-16 DIAGNOSIS — M25512 Pain in left shoulder: Secondary | ICD-10-CM | POA: Diagnosis not present

## 2017-01-16 DIAGNOSIS — S42202D Unspecified fracture of upper end of left humerus, subsequent encounter for fracture with routine healing: Secondary | ICD-10-CM | POA: Diagnosis not present

## 2017-01-23 DIAGNOSIS — S42202D Unspecified fracture of upper end of left humerus, subsequent encounter for fracture with routine healing: Secondary | ICD-10-CM | POA: Diagnosis not present

## 2017-01-23 DIAGNOSIS — M25512 Pain in left shoulder: Secondary | ICD-10-CM | POA: Diagnosis not present

## 2017-01-26 DIAGNOSIS — M25512 Pain in left shoulder: Secondary | ICD-10-CM | POA: Diagnosis not present

## 2017-01-26 DIAGNOSIS — S42202D Unspecified fracture of upper end of left humerus, subsequent encounter for fracture with routine healing: Secondary | ICD-10-CM | POA: Diagnosis not present

## 2017-01-30 DIAGNOSIS — M25512 Pain in left shoulder: Secondary | ICD-10-CM | POA: Diagnosis not present

## 2017-01-30 DIAGNOSIS — S42202D Unspecified fracture of upper end of left humerus, subsequent encounter for fracture with routine healing: Secondary | ICD-10-CM | POA: Diagnosis not present

## 2017-02-05 DIAGNOSIS — M25612 Stiffness of left shoulder, not elsewhere classified: Secondary | ICD-10-CM | POA: Diagnosis not present

## 2017-02-05 DIAGNOSIS — S42202D Unspecified fracture of upper end of left humerus, subsequent encounter for fracture with routine healing: Secondary | ICD-10-CM | POA: Diagnosis not present

## 2017-02-16 DIAGNOSIS — S42202D Unspecified fracture of upper end of left humerus, subsequent encounter for fracture with routine healing: Secondary | ICD-10-CM | POA: Diagnosis not present

## 2017-02-16 DIAGNOSIS — M25512 Pain in left shoulder: Secondary | ICD-10-CM | POA: Diagnosis not present

## 2017-02-20 DIAGNOSIS — M25512 Pain in left shoulder: Secondary | ICD-10-CM | POA: Diagnosis not present

## 2017-02-20 DIAGNOSIS — S42202D Unspecified fracture of upper end of left humerus, subsequent encounter for fracture with routine healing: Secondary | ICD-10-CM | POA: Diagnosis not present

## 2017-02-23 DIAGNOSIS — M25512 Pain in left shoulder: Secondary | ICD-10-CM | POA: Diagnosis not present

## 2017-02-23 DIAGNOSIS — S42202D Unspecified fracture of upper end of left humerus, subsequent encounter for fracture with routine healing: Secondary | ICD-10-CM | POA: Diagnosis not present

## 2017-02-27 DIAGNOSIS — M25512 Pain in left shoulder: Secondary | ICD-10-CM | POA: Diagnosis not present

## 2017-02-27 DIAGNOSIS — S42202D Unspecified fracture of upper end of left humerus, subsequent encounter for fracture with routine healing: Secondary | ICD-10-CM | POA: Diagnosis not present

## 2017-03-01 ENCOUNTER — Inpatient Hospital Stay: Payer: Managed Care, Other (non HMO)

## 2017-03-01 DIAGNOSIS — S42202D Unspecified fracture of upper end of left humerus, subsequent encounter for fracture with routine healing: Secondary | ICD-10-CM | POA: Diagnosis not present

## 2017-03-05 ENCOUNTER — Inpatient Hospital Stay: Payer: Managed Care, Other (non HMO) | Attending: Oncology

## 2017-03-05 DIAGNOSIS — D509 Iron deficiency anemia, unspecified: Secondary | ICD-10-CM | POA: Insufficient documentation

## 2017-03-05 DIAGNOSIS — E538 Deficiency of other specified B group vitamins: Secondary | ICD-10-CM | POA: Insufficient documentation

## 2017-03-05 DIAGNOSIS — Z9884 Bariatric surgery status: Secondary | ICD-10-CM | POA: Insufficient documentation

## 2017-03-22 DIAGNOSIS — M25512 Pain in left shoulder: Secondary | ICD-10-CM | POA: Diagnosis not present

## 2017-03-22 DIAGNOSIS — M25612 Stiffness of left shoulder, not elsewhere classified: Secondary | ICD-10-CM | POA: Diagnosis not present

## 2017-03-22 DIAGNOSIS — R609 Edema, unspecified: Secondary | ICD-10-CM | POA: Diagnosis not present

## 2017-03-27 DIAGNOSIS — M25512 Pain in left shoulder: Secondary | ICD-10-CM | POA: Diagnosis not present

## 2017-03-27 DIAGNOSIS — M25612 Stiffness of left shoulder, not elsewhere classified: Secondary | ICD-10-CM | POA: Diagnosis not present

## 2017-03-28 ENCOUNTER — Inpatient Hospital Stay: Payer: Managed Care, Other (non HMO)

## 2017-03-28 DIAGNOSIS — Z9884 Bariatric surgery status: Secondary | ICD-10-CM

## 2017-03-28 DIAGNOSIS — R790 Abnormal level of blood mineral: Secondary | ICD-10-CM

## 2017-03-28 DIAGNOSIS — E538 Deficiency of other specified B group vitamins: Secondary | ICD-10-CM | POA: Diagnosis not present

## 2017-03-28 DIAGNOSIS — D509 Iron deficiency anemia, unspecified: Secondary | ICD-10-CM | POA: Diagnosis not present

## 2017-03-28 LAB — CBC WITH DIFFERENTIAL/PLATELET
BASOS ABS: 0.1 10*3/uL (ref 0–0.1)
BASOS PCT: 1 %
EOS PCT: 2 %
Eosinophils Absolute: 0.2 10*3/uL (ref 0–0.7)
HEMATOCRIT: 45.8 % (ref 35.0–47.0)
Hemoglobin: 15.5 g/dL (ref 12.0–16.0)
Lymphocytes Relative: 18 %
Lymphs Abs: 1.3 10*3/uL (ref 1.0–3.6)
MCH: 29.7 pg (ref 26.0–34.0)
MCHC: 33.8 g/dL (ref 32.0–36.0)
MCV: 87.8 fL (ref 80.0–100.0)
MONO ABS: 0.5 10*3/uL (ref 0.2–0.9)
Monocytes Relative: 7 %
NEUTROS ABS: 5 10*3/uL (ref 1.4–6.5)
Neutrophils Relative %: 72 %
PLATELETS: 220 10*3/uL (ref 150–440)
RBC: 5.21 MIL/uL — ABNORMAL HIGH (ref 3.80–5.20)
RDW: 14.2 % (ref 11.5–14.5)
WBC: 7 10*3/uL (ref 3.6–11.0)

## 2017-03-28 LAB — IRON AND TIBC
IRON: 70 ug/dL (ref 28–170)
Saturation Ratios: 28 % (ref 10.4–31.8)
TIBC: 251 ug/dL (ref 250–450)
UIBC: 181 ug/dL

## 2017-03-28 LAB — VITAMIN B12: Vitamin B-12: 405 pg/mL (ref 180–914)

## 2017-03-28 LAB — FERRITIN: FERRITIN: 97 ng/mL (ref 11–307)

## 2017-03-28 LAB — FOLATE: Folate: 7 ng/mL (ref >5.9)

## 2017-03-30 DIAGNOSIS — R609 Edema, unspecified: Secondary | ICD-10-CM | POA: Diagnosis not present

## 2017-03-30 DIAGNOSIS — M25612 Stiffness of left shoulder, not elsewhere classified: Secondary | ICD-10-CM | POA: Diagnosis not present

## 2017-03-30 DIAGNOSIS — M25512 Pain in left shoulder: Secondary | ICD-10-CM | POA: Diagnosis not present

## 2017-04-03 DIAGNOSIS — R609 Edema, unspecified: Secondary | ICD-10-CM | POA: Diagnosis not present

## 2017-04-03 DIAGNOSIS — S42302A Unspecified fracture of shaft of humerus, left arm, initial encounter for closed fracture: Secondary | ICD-10-CM | POA: Diagnosis not present

## 2017-04-03 DIAGNOSIS — M25612 Stiffness of left shoulder, not elsewhere classified: Secondary | ICD-10-CM | POA: Diagnosis not present

## 2017-04-03 DIAGNOSIS — M25512 Pain in left shoulder: Secondary | ICD-10-CM | POA: Diagnosis not present

## 2017-04-03 DIAGNOSIS — M7712 Lateral epicondylitis, left elbow: Secondary | ICD-10-CM | POA: Diagnosis not present

## 2017-04-10 DIAGNOSIS — M25512 Pain in left shoulder: Secondary | ICD-10-CM | POA: Diagnosis not present

## 2017-04-10 DIAGNOSIS — M25612 Stiffness of left shoulder, not elsewhere classified: Secondary | ICD-10-CM | POA: Diagnosis not present

## 2017-04-10 DIAGNOSIS — R609 Edema, unspecified: Secondary | ICD-10-CM | POA: Diagnosis not present

## 2017-04-12 DIAGNOSIS — M25612 Stiffness of left shoulder, not elsewhere classified: Secondary | ICD-10-CM | POA: Diagnosis not present

## 2017-04-12 DIAGNOSIS — R609 Edema, unspecified: Secondary | ICD-10-CM | POA: Diagnosis not present

## 2017-04-12 DIAGNOSIS — M25512 Pain in left shoulder: Secondary | ICD-10-CM | POA: Diagnosis not present

## 2017-04-27 DIAGNOSIS — R69 Illness, unspecified: Secondary | ICD-10-CM | POA: Diagnosis not present

## 2017-04-27 DIAGNOSIS — Z760 Encounter for issue of repeat prescription: Secondary | ICD-10-CM | POA: Diagnosis not present

## 2017-04-27 DIAGNOSIS — F900 Attention-deficit hyperactivity disorder, predominantly inattentive type: Secondary | ICD-10-CM | POA: Diagnosis not present

## 2017-05-02 DIAGNOSIS — M25612 Stiffness of left shoulder, not elsewhere classified: Secondary | ICD-10-CM | POA: Diagnosis not present

## 2017-05-02 DIAGNOSIS — M25512 Pain in left shoulder: Secondary | ICD-10-CM | POA: Diagnosis not present

## 2017-05-07 DIAGNOSIS — R609 Edema, unspecified: Secondary | ICD-10-CM | POA: Diagnosis not present

## 2017-05-07 DIAGNOSIS — M25612 Stiffness of left shoulder, not elsewhere classified: Secondary | ICD-10-CM | POA: Diagnosis not present

## 2017-05-07 DIAGNOSIS — M25512 Pain in left shoulder: Secondary | ICD-10-CM | POA: Diagnosis not present

## 2017-05-21 ENCOUNTER — Inpatient Hospital Stay: Payer: Managed Care, Other (non HMO) | Attending: Oncology

## 2017-05-25 ENCOUNTER — Inpatient Hospital Stay: Payer: Managed Care, Other (non HMO) | Admitting: Oncology

## 2017-05-25 ENCOUNTER — Inpatient Hospital Stay: Payer: Managed Care, Other (non HMO)

## 2017-05-31 ENCOUNTER — Other Ambulatory Visit: Payer: Self-pay | Admitting: Oncology

## 2017-05-31 DIAGNOSIS — E538 Deficiency of other specified B group vitamins: Secondary | ICD-10-CM

## 2017-06-11 ENCOUNTER — Inpatient Hospital Stay: Payer: Managed Care, Other (non HMO)

## 2017-06-11 ENCOUNTER — Inpatient Hospital Stay: Payer: Managed Care, Other (non HMO) | Admitting: Oncology

## 2017-06-15 ENCOUNTER — Inpatient Hospital Stay: Payer: 59 | Attending: Oncology

## 2017-06-15 DIAGNOSIS — Z9884 Bariatric surgery status: Secondary | ICD-10-CM | POA: Insufficient documentation

## 2017-06-15 DIAGNOSIS — Z79899 Other long term (current) drug therapy: Secondary | ICD-10-CM | POA: Diagnosis not present

## 2017-06-15 DIAGNOSIS — R5381 Other malaise: Secondary | ICD-10-CM | POA: Insufficient documentation

## 2017-06-15 DIAGNOSIS — E669 Obesity, unspecified: Secondary | ICD-10-CM | POA: Diagnosis not present

## 2017-06-15 DIAGNOSIS — R790 Abnormal level of blood mineral: Secondary | ICD-10-CM

## 2017-06-15 DIAGNOSIS — D508 Other iron deficiency anemias: Secondary | ICD-10-CM | POA: Insufficient documentation

## 2017-06-15 DIAGNOSIS — E538 Deficiency of other specified B group vitamins: Secondary | ICD-10-CM | POA: Insufficient documentation

## 2017-06-15 DIAGNOSIS — R5383 Other fatigue: Secondary | ICD-10-CM | POA: Diagnosis not present

## 2017-06-15 LAB — CBC WITH DIFFERENTIAL/PLATELET
BASOS ABS: 0.1 10*3/uL (ref 0–0.1)
BASOS PCT: 1 %
EOS PCT: 1 %
Eosinophils Absolute: 0.1 10*3/uL (ref 0–0.7)
HCT: 46.9 % (ref 35.0–47.0)
Hemoglobin: 15.9 g/dL (ref 12.0–16.0)
Lymphocytes Relative: 14 %
Lymphs Abs: 1.1 10*3/uL (ref 1.0–3.6)
MCH: 29 pg (ref 26.0–34.0)
MCHC: 33.9 g/dL (ref 32.0–36.0)
MCV: 85.6 fL (ref 80.0–100.0)
MONO ABS: 0.4 10*3/uL (ref 0.2–0.9)
Monocytes Relative: 5 %
Neutro Abs: 6.4 10*3/uL (ref 1.4–6.5)
Neutrophils Relative %: 79 %
PLATELETS: 212 10*3/uL (ref 150–440)
RBC: 5.47 MIL/uL — ABNORMAL HIGH (ref 3.80–5.20)
RDW: 14.3 % (ref 11.5–14.5)
WBC: 8.2 10*3/uL (ref 3.6–11.0)

## 2017-06-15 LAB — IRON AND TIBC
IRON: 51 ug/dL (ref 28–170)
Saturation Ratios: 21 % (ref 10.4–31.8)
TIBC: 243 ug/dL — AB (ref 250–450)
UIBC: 192 ug/dL

## 2017-06-15 LAB — FERRITIN: FERRITIN: 106 ng/mL (ref 11–307)

## 2017-06-16 LAB — VITAMIN B12: VITAMIN B 12: 545 pg/mL (ref 180–914)

## 2017-06-18 ENCOUNTER — Inpatient Hospital Stay: Payer: 59

## 2017-06-18 ENCOUNTER — Encounter: Payer: Self-pay | Admitting: Oncology

## 2017-06-18 ENCOUNTER — Inpatient Hospital Stay (HOSPITAL_BASED_OUTPATIENT_CLINIC_OR_DEPARTMENT_OTHER): Payer: 59 | Admitting: Oncology

## 2017-06-18 VITALS — BP 128/79 | HR 74 | Temp 97.7°F | Resp 18 | Ht 66.0 in | Wt 244.0 lb

## 2017-06-18 DIAGNOSIS — Z79899 Other long term (current) drug therapy: Secondary | ICD-10-CM | POA: Diagnosis not present

## 2017-06-18 DIAGNOSIS — E669 Obesity, unspecified: Secondary | ICD-10-CM

## 2017-06-18 DIAGNOSIS — D509 Iron deficiency anemia, unspecified: Secondary | ICD-10-CM

## 2017-06-18 DIAGNOSIS — D508 Other iron deficiency anemias: Secondary | ICD-10-CM | POA: Diagnosis not present

## 2017-06-18 DIAGNOSIS — R5383 Other fatigue: Secondary | ICD-10-CM | POA: Diagnosis not present

## 2017-06-18 DIAGNOSIS — Z9884 Bariatric surgery status: Secondary | ICD-10-CM

## 2017-06-18 DIAGNOSIS — R5381 Other malaise: Secondary | ICD-10-CM

## 2017-06-18 DIAGNOSIS — E538 Deficiency of other specified B group vitamins: Secondary | ICD-10-CM | POA: Diagnosis not present

## 2017-06-18 NOTE — Progress Notes (Signed)
No new changes noted today 

## 2017-06-18 NOTE — Progress Notes (Signed)
Hematology/Oncology Consult note [facility]  Telephone:([phone] [phone]  Patient Care Team: Rayetta Humphrey, MD as PCP - General (Family Medicine)   Name of the patient: Erin Howe  [id number]  [birth year]   Date of visit: 06/18/17  Diagnosis- iron deficiency anemia due to gastric bypass  Chief complaint/ Reason for visit- routine f/u iron deficiency anemia  Heme/Onc history: Patient is a 47 year old female who was previously seen by Dr. Bertis Ruddy for iron deficiency anemia. She had normal CBC up until 2011. She then began to have progressive microcytic anemia starting 2012. Her hemoglobin was 8 with an MCV of 64 in August 2017. Ferritin was low at 3. She also has B12 deficiency and self-administered B12 injections at home. No routine use of NSAIDs. She has been on oral iron in the past with no improvement in her anemia. She does have a history of gastric bypass surgery in the past. Last dose of IV iron was in December 2017.   Interval history- reports being under stress from working 2 jobs. She has not been sleeping well and feels fatigued.  ECOG PS- 0 Pain scale- 0   Review of systems- Review of Systems  Constitutional: Positive for malaise/fatigue. Negative for chills, fever and weight loss.  HENT: Negative for congestion, ear discharge and nosebleeds.   Eyes: Negative for blurred vision.  Respiratory: Negative for cough, hemoptysis, sputum production, shortness of breath and wheezing.   Cardiovascular: Negative for chest pain, palpitations, orthopnea and claudication.  Gastrointestinal: Negative for abdominal pain, blood in stool, constipation, diarrhea, heartburn, melena, nausea and vomiting.  Genitourinary: Negative for dysuria, flank pain, frequency, hematuria and urgency.  Musculoskeletal: Negative for back pain, joint pain and myalgias.  Skin: Negative for rash.  Neurological: Negative for dizziness, tingling, focal weakness,  seizures, weakness and headaches.  Endo/Heme/Allergies: Does not bruise/bleed easily.  Psychiatric/Behavioral: Negative for depression and suicidal ideas. The patient does not have insomnia.       Allergies  Allergen Reactions  . Amoxicillin   . Penicillins Swelling  . Tetanus Toxoid   . Tetanus Toxoids      Past Medical History:  Diagnosis Date  . Anemia      Past Surgical History:  Procedure Laterality Date  . CHOLECYSTECTOMY    . GASTRIC BYPASS  02-03-08  . REFRACTIVE SURGERY  2006  . WISDOM TOOTH EXTRACTION      Social History   Socioeconomic History  . Marital status: Married    Spouse name: Not on file  . Number of children: Not on file  . Years of education: Not on file  . Highest education level: Not on file  Occupational History  . 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 Financial resource strain: Not on file  . Food insecurity:    Worry: Not on file    Inability: Not on file  . Transportation needs:    Medical: Not on file    Non-medical: Not on file  Tobacco Use  . Smoking status: Never Smoker  . Smokeless tobacco: Former Engineer, water and Sexual Activity  . Alcohol use: No  . Drug use: No  . Sexual activity: Yes    Partners: Male  Lifestyle  . Physical activity:    Days per week: Not on file    Minutes per session: Not on file  . Stress: Not on file  Relationships  . Social connections:    Talks on phone: Not on  file    Gets together: Not on file    Attends religious service: Not on file    Active member of club or organization: Not on file    Attends meetings of clubs or organizations: Not on file    Relationship status: Not on file  . Intimate partner violence:    Fear of current or ex partner: Not on file    Emotionally abused: Not on file    Physically abused: Not on file    Forced sexual activity: Not on file  Other Topics Concern  . Not on file  Social History Narrative   Exercise-- walking, water aerobics     Family History  Problem Relation Age of Onset  . Atrial fibrillation Mother   . Heart failure Mother   . Deep vein thrombosis Mother   . Diabetes Mother   . Hyperlipidemia Mother   . Hypertension Mother   . Hyperlipidemia Father   . Hypertension Father   . Diabetes Sister   . Stroke Maternal Grandfather   . Heart disease Maternal Grandmother   . Breast cancer Cousin      Current Outpatient Medications:  .  amphetamine-dextroamphetamine (ADDERALL XR) 30 MG 24 hr capsule, Take by mouth., Disp: , Rfl:  .  amphetamine-dextroamphetamine (ADDERALL) 20 MG tablet, 1 po q afternoon, Disp: 90 tablet, Rfl: 0 .  cyanocobalamin (,VITAMIN B-12,) 1000 MCG/ML injection, Inject 1 mL (1,000 mcg total) into the skin every 30 (thirty) days., Disp: 3 mL, Rfl: 3 .  cyclobenzaprine (FLEXERIL) 5 MG tablet, Take 1-2 tablets (5-10 mg total) by mouth 3 (three) times daily as needed for muscle spasms., Disp: 20 tablet, Rfl: 0 .  loratadine (CLARITIN) 10 MG tablet, Take 1 tablet (10 mg total) by mouth daily., Disp: 30 tablet, Rfl: 11 .  meloxicam (MOBIC) 7.5 MG tablet, , Disp: , Rfl:  .  omeprazole (PRILOSEC) 40 MG capsule, Take 1 capsule (40 mg total) by mouth daily., Disp: 90 capsule, Rfl: 3 .  Vitamin D, Ergocalciferol, (DRISDOL) 50000 units CAPS capsule, TAKE ONE CAPSULE BY MOUTH WEEKLY AS ONE DOSE, Disp: 12 capsule, Rfl: 0 .  clonazePAM (KLONOPIN) 0.5 MG tablet, TAKE 1 TABLET BY MOUTH NIGHTLY AS NEEDED FOR ANXIETY, Disp: , Rfl: 2 .  oxyCODONE-acetaminophen (ROXICET) 5-325 MG tablet, Take 1 tablet by mouth every 6 (six) hours as needed for severe pain. (Patient not taking: Reported on 06/18/2017), Disp: 20 tablet, Rfl: 0  Physical exam:  Vitals:   06/18/17 1326  BP: 128/79  Pulse: 74  Resp: 18  Temp: 97.7 F (36.5 C)  TempSrc: Tympanic  SpO2: 99%  Weight: 244 lb 0.8 oz (110.7 kg)  Height: 5\' 6"  (1.676 m)   Physical Exam  Constitutional: She is oriented to person, place, and time.  She is  obese. Does not appear to be in acute distress  HENT:  Head: Normocephalic and atraumatic.  Eyes: Pupils are equal, round, and reactive to [place]. EOM are normal.  Neck: Normal range of motion.  Cardiovascular: Normal rate, regular rhythm and normal heart sounds.  Pulmonary/Chest: Effort normal and breath sounds normal.  Abdominal: Soft. Bowel sounds are normal.  Neurological: She is alert and oriented to person, place, and time.  Skin: Skin is warm and dry.     CMP Latest Ref Rng & Units 04/13/2014  Glucose mg/dL 161(W108(H)  BUN mg/dL 9  Creatinine mg/dL 9.600.62  Sodium mmol/L 454138  Potassium mmol/L 4.0  Chloride mmol/L 105  CO2 mmol/L 28  Calcium  mg/dL 9.6(E)  Total Protein g/dL -  Total Bilirubin mg/dL -  Alkaline Phos U/L -  AST U/L -  ALT U/L -   CBC Latest Ref Rng & Units 06/15/2017  WBC 3.6 - 11.0 K/uL 8.2  Hemoglobin 12.0 - 16.0 g/dL 45.4  Hematocrit 09.8 - 47.0 % 46.9  Platelets 150 - 440 K/uL 212     Assessment and plan- Patient is a 47 y.o. female with iron deficiency anemia likely due to gastric bypass  She is not anemic today and iron studies are normal. She last received IV iron 6 months ago. Her fatigue is not due to iron deficiency. She is on b12 shots  Repeat cbc ferritin and iron studies in 3 and 6 months. I will see her back in 6 months   Visit Diagnosis 1. Iron deficiency anemia, unspecified iron deficiency anemia type      Dr. Owens Shark, MD, MPH [facility] [phone] 06/18/2017 1:40 PM

## 2017-08-03 DIAGNOSIS — R69 Illness, unspecified: Secondary | ICD-10-CM | POA: Diagnosis not present

## 2017-08-03 DIAGNOSIS — F900 Attention-deficit hyperactivity disorder, predominantly inattentive type: Secondary | ICD-10-CM | POA: Diagnosis not present

## 2017-08-03 DIAGNOSIS — Z79899 Other long term (current) drug therapy: Secondary | ICD-10-CM | POA: Diagnosis not present

## 2017-08-03 DIAGNOSIS — E6609 Other obesity due to excess calories: Secondary | ICD-10-CM | POA: Diagnosis not present

## 2017-09-07 DIAGNOSIS — Z23 Encounter for immunization: Secondary | ICD-10-CM | POA: Diagnosis not present

## 2017-09-07 DIAGNOSIS — R69 Illness, unspecified: Secondary | ICD-10-CM | POA: Diagnosis not present

## 2017-09-17 ENCOUNTER — Inpatient Hospital Stay: Payer: 59 | Attending: Oncology

## 2017-09-17 ENCOUNTER — Inpatient Hospital Stay: Payer: 59

## 2017-09-17 DIAGNOSIS — Z9884 Bariatric surgery status: Secondary | ICD-10-CM | POA: Insufficient documentation

## 2017-09-17 DIAGNOSIS — D508 Other iron deficiency anemias: Secondary | ICD-10-CM | POA: Insufficient documentation

## 2017-09-17 DIAGNOSIS — E538 Deficiency of other specified B group vitamins: Secondary | ICD-10-CM | POA: Insufficient documentation

## 2017-09-17 DIAGNOSIS — D509 Iron deficiency anemia, unspecified: Secondary | ICD-10-CM

## 2017-09-17 LAB — CBC WITH DIFFERENTIAL/PLATELET
BASOS ABS: 0.1 10*3/uL (ref 0–0.1)
BASOS PCT: 1 %
EOS ABS: 0.2 10*3/uL (ref 0–0.7)
Eosinophils Relative: 2 %
HEMATOCRIT: 44.8 % (ref 35.0–47.0)
HEMOGLOBIN: 15 g/dL (ref 12.0–16.0)
Lymphocytes Relative: 14 %
Lymphs Abs: 1.1 10*3/uL (ref 1.0–3.6)
MCH: 29.1 pg (ref 26.0–34.0)
MCHC: 33.5 g/dL (ref 32.0–36.0)
MCV: 86.8 fL (ref 80.0–100.0)
Monocytes Absolute: 0.5 10*3/uL (ref 0.2–0.9)
Monocytes Relative: 6 %
NEUTROS PCT: 77 %
Neutro Abs: 6.1 10*3/uL (ref 1.4–6.5)
Platelets: 227 10*3/uL (ref 150–440)
RBC: 5.16 MIL/uL (ref 3.80–5.20)
RDW: 14.3 % (ref 11.5–14.5)
WBC: 8 10*3/uL (ref 3.6–11.0)

## 2017-09-17 LAB — IRON AND TIBC
Iron: 56 ug/dL (ref 28–170)
Saturation Ratios: 22 % (ref 10.4–31.8)
TIBC: 260 ug/dL (ref 250–450)
UIBC: 204 ug/dL

## 2017-09-17 LAB — FERRITIN: FERRITIN: 84 ng/mL (ref 11–307)

## 2017-09-21 ENCOUNTER — Other Ambulatory Visit: Payer: Self-pay | Admitting: Family Medicine

## 2017-09-21 DIAGNOSIS — E119 Type 2 diabetes mellitus without complications: Secondary | ICD-10-CM | POA: Diagnosis not present

## 2017-09-21 DIAGNOSIS — Z1239 Encounter for other screening for malignant neoplasm of breast: Secondary | ICD-10-CM | POA: Diagnosis not present

## 2017-09-21 DIAGNOSIS — R69 Illness, unspecified: Secondary | ICD-10-CM | POA: Diagnosis not present

## 2017-09-21 DIAGNOSIS — Z1231 Encounter for screening mammogram for malignant neoplasm of breast: Secondary | ICD-10-CM

## 2017-09-26 ENCOUNTER — Ambulatory Visit
Admission: RE | Admit: 2017-09-26 | Discharge: 2017-09-26 | Disposition: A | Discharge status: Home / Self Care | Payer: 59 | Source: Ambulatory Visit | Attending: Family Medicine | Admitting: Family Medicine

## 2017-09-26 DIAGNOSIS — Z1231 Encounter for screening mammogram for malignant neoplasm of breast: Secondary | ICD-10-CM | POA: Diagnosis not present

## 2017-10-24 ENCOUNTER — Other Ambulatory Visit: Payer: Self-pay | Admitting: Oncology

## 2017-10-24 DIAGNOSIS — E538 Deficiency of other specified B group vitamins: Secondary | ICD-10-CM

## 2017-10-24 NOTE — Telephone Encounter (Signed)
   Ref Range & Units 33mo ago 31mo ago 97yr ago  Vitamin B-12 180 - 914 pg/mL 545  405 CM 872 R  Comment: (NOTE)

## 2017-11-12 DIAGNOSIS — H35011 Changes in retinal vascular appearance, right eye: Secondary | ICD-10-CM | POA: Diagnosis not present

## 2017-12-12 ENCOUNTER — Inpatient Hospital Stay: Payer: 59 | Attending: Oncology

## 2017-12-12 DIAGNOSIS — E538 Deficiency of other specified B group vitamins: Secondary | ICD-10-CM | POA: Insufficient documentation

## 2017-12-12 DIAGNOSIS — D509 Iron deficiency anemia, unspecified: Secondary | ICD-10-CM

## 2017-12-12 DIAGNOSIS — D508 Other iron deficiency anemias: Secondary | ICD-10-CM | POA: Insufficient documentation

## 2017-12-12 DIAGNOSIS — Z9884 Bariatric surgery status: Secondary | ICD-10-CM | POA: Insufficient documentation

## 2017-12-12 LAB — CBC WITH DIFFERENTIAL/PLATELET
Abs Immature Granulocytes: 0.05 10*3/uL (ref 0.00–0.07)
Basophils Absolute: 0 10*3/uL (ref 0.0–0.1)
Basophils Relative: 1 %
EOS PCT: 3 %
Eosinophils Absolute: 0.2 10*3/uL (ref 0.0–0.5)
HCT: 46.8 % — ABNORMAL HIGH (ref 36.0–46.0)
Hemoglobin: 15.7 g/dL — ABNORMAL HIGH (ref 12.0–15.0)
IMMATURE GRANULOCYTES: 1 %
Lymphocytes Relative: 22 %
Lymphs Abs: 1.6 10*3/uL (ref 0.7–4.0)
MCH: 28.9 pg (ref 26.0–34.0)
MCHC: 33.5 g/dL (ref 30.0–36.0)
MCV: 86 fL (ref 80.0–100.0)
MONO ABS: 0.5 10*3/uL (ref 0.1–1.0)
MONOS PCT: 7 %
NEUTROS ABS: 5.2 10*3/uL (ref 1.7–7.7)
NEUTROS PCT: 66 %
PLATELETS: 227 10*3/uL (ref 150–400)
RBC: 5.44 MIL/uL — ABNORMAL HIGH (ref 3.87–5.11)
RDW: 13.2 % (ref 11.5–15.5)
WBC: 7.6 10*3/uL (ref 4.0–10.5)
nRBC: 0 % (ref 0.0–0.2)

## 2017-12-12 LAB — IRON AND TIBC
Iron: 59 ug/dL (ref 28–170)
Saturation Ratios: 20 % (ref 10.4–31.8)
TIBC: 291 ug/dL (ref 250–450)
UIBC: 232 ug/dL

## 2017-12-12 LAB — FERRITIN: FERRITIN: 73 ng/mL (ref 11–307)

## 2017-12-14 ENCOUNTER — Other Ambulatory Visit: Payer: Self-pay

## 2017-12-14 DIAGNOSIS — D509 Iron deficiency anemia, unspecified: Secondary | ICD-10-CM

## 2017-12-17 ENCOUNTER — Inpatient Hospital Stay: Payer: 59 | Admitting: Oncology

## 2017-12-17 ENCOUNTER — Inpatient Hospital Stay: Payer: 59

## 2017-12-19 DIAGNOSIS — R69 Illness, unspecified: Secondary | ICD-10-CM | POA: Diagnosis not present

## 2017-12-19 DIAGNOSIS — F419 Anxiety disorder, unspecified: Secondary | ICD-10-CM | POA: Diagnosis not present

## 2018-01-12 ENCOUNTER — Other Ambulatory Visit: Payer: Self-pay | Admitting: Hematology and Oncology

## 2018-01-12 DIAGNOSIS — E538 Deficiency of other specified B group vitamins: Secondary | ICD-10-CM

## 2018-01-12 DIAGNOSIS — D508 Other iron deficiency anemias: Secondary | ICD-10-CM

## 2018-01-12 NOTE — Progress Notes (Deleted)
[facility]-  Cancer Center  Clinic day:  01/12/2018  Chief Complaint: Erin Howe is a 48 y.o. female  ***  HPI: ***  Seen by Dr Smith Robertao on 06/18/2017:  who was previously seen by Dr. Bertis RuddyGorsuch for iron deficiency anemia. She had normal CBC up until 2011. She then began to have progressive microcytic anemia starting 2012. Her hemoglobin was 8 with an MCV of 64 in August 2017. Ferritin was low at 3. She also has B12 deficiency and self-administered B12 injections at home. No routine use of NSAIDs. She has been on oral iron in the past with no improvement in her anemia. She does have a history of gastric bypass surgery in the past. Last dose of IV iron was in December 2017.   Interval history- reports being under stress from working 2 jobs. She has not been sleeping well and feels fatigued.  xxx  with iron deficiency anemia likely due to gastric bypass  She is not anemic today and iron studies are normal. She last received IV iron 6 months ago. Her fatigue is not due to iron deficiency. She is on b12 shots  Repeat cbc ferritin and iron studies in 3 and 6 months. I will see her back in 6 months  xxx  She has received Feraheme (12/03/2015, 12/09/2015, 05/16/2016, 05/24/2016, 12/07/2016, 12/29/2016).  Ferritin has been followed: 11.1 on 11/25/2008, 2.6 on 10/08/2012, 2 on 11/24/2015, 17 on 01/05/2016, 6 on 05/08/2016, 19 on 11/22/2016, 97 on 03/28/2017, 106 on 06/15/2017, 84 on 09/17/2017, 73 on 12/12/2017.  B12 was 210 on 11/25/2008, 345 on 09/03/2009, 408 on 08/22/2010, 413 on 10/09/2011, 804 on 10/08/2012, 872 on 11/03/2013, 405 on 03/28/2017, and 545 on 06/15/2017.  Folate was 13.1 on 11/25/2008, 6.6 on 09/03/2009, 7.0 on 03/28/2017.   Past Medical History:  Diagnosis Date  . Anemia     Past Surgical History:  Procedure Laterality Date  . CHOLECYSTECTOMY    . GASTRIC BYPASS  02-03-08  . REFRACTIVE SURGERY  2006  . WISDOM TOOTH EXTRACTION       Family History  Problem Relation Age of Onset  . Atrial fibrillation Mother   . Heart failure Mother   . Deep vein thrombosis Mother   . Diabetes Mother   . Hyperlipidemia Mother   . Hypertension Mother   . Hyperlipidemia Father   . Hypertension Father   . Diabetes Sister   . Stroke Maternal Grandfather   . Heart disease Maternal Grandmother   . Breast cancer Cousin     Social History:  reports that she has never smoked. She has quit using smokeless tobacco. She reports that she does not drink alcohol or use drugs.  The patient is accompanied by *** alone today.  Allergies:  Allergies  Allergen Reactions  . Amoxicillin   . Penicillins Swelling  . Tetanus Toxoid   . Tetanus Toxoids     Current Medications: Current Outpatient Medications  Medication Sig Dispense Refill  . amphetamine-dextroamphetamine (ADDERALL XR) 30 MG 24 hr capsule Take by mouth.    Marland Kitchen. amphetamine-dextroamphetamine (ADDERALL) 20 MG tablet 1 po q afternoon 90 tablet 0  . clonazePAM (KLONOPIN) 0.5 MG tablet TAKE 1 TABLET BY MOUTH NIGHTLY AS NEEDED FOR ANXIETY  2  . cyanocobalamin (,VITAMIN B-12,) 1000 MCG/ML injection INJECT 1 ML (1,000 MCG TOTAL) INTO THE SKIN EVERY 30 (THIRTY) DAYS. 1 mL 11  . cyclobenzaprine (FLEXERIL) 5 MG tablet Take 1-2 tablets (5-10 mg total) by mouth 3 (three) times daily as  needed for muscle spasms. 20 tablet 0  . loratadine (CLARITIN) 10 MG tablet Take 1 tablet (10 mg total) by mouth daily. 30 tablet 11  . meloxicam (MOBIC) 7.5 MG tablet     . omeprazole (PRILOSEC) 40 MG capsule Take 1 capsule (40 mg total) by mouth daily. 90 capsule 3  . oxyCODONE-acetaminophen (ROXICET) 5-325 MG tablet Take 1 tablet by mouth every 6 (six) hours as needed for severe pain. (Patient not taking: Reported on 06/18/2017) 20 tablet 0  . Vitamin D, Ergocalciferol, (DRISDOL) 50000 units CAPS capsule TAKE ONE CAPSULE BY MOUTH WEEKLY AS ONE DOSE 12 capsule 0   No current facility-administered medications  for this visit.     Review of Systems:  GENERAL:  Feels good.  Active.  No fevers, sweats or weight loss. PERFORMANCE STATUS (ECOG):  *** HEENT:  No visual changes, runny nose, sore throat, mouth sores or tenderness. Lungs: No shortness of breath or cough.  No hemoptysis. Cardiac:  No chest pain, palpitations, orthopnea, or PND. GI:  No nausea, vomiting, diarrhea, constipation, melena or hematochezia. GU:  No urgency, frequency, dysuria, or hematuria. Musculoskeletal:  No back pain.  No joint pain.  No muscle tenderness. Extremities:  No pain or swelling. Skin:  No rashes or skin changes. Neuro:  No headache, numbness or weakness, balance or coordination issues. Endocrine:  No diabetes, thyroid issues, hot flashes or night sweats. Psych:  No mood changes, depression or anxiety. Pain:  No focal pain. Review of systems:  All other systems reviewed and found to be negative.  Physical Exam: There were no vitals taken for this visit. GENERAL:  Well developed, well nourished, **man sitting comfortably in the exam room in no acute distress. MENTAL STATUS:  Alert and oriented to person, place and time. HEAD:  *** hair.  Normocephalic, atraumatic, face symmetric, no Cushingoid features. EYES:  *** eyes.  Pupils equal round and reactive to [place] and accomodation.  No conjunctivitis or scleral icterus. ENT:  Oropharynx clear without lesion.  Tongue normal. Mucous membranes moist.  RESPIRATORY:  Clear to auscultation without rales, wheezes or rhonchi. CARDIOVASCULAR:  Regular rate and rhythm without murmur, rub or gallop. ABDOMEN:  Soft, non-tender, with active bowel sounds, and no hepatosplenomegaly.  No masses. SKIN:  No rashes, ulcers or lesions. EXTREMITIES: No edema, no skin discoloration or tenderness.  No palpable cords. LYMPH NODES: No palpable cervical, supraclavicular, axillary or inguinal adenopathy  NEUROLOGICAL: Unremarkable. PSYCH:  Appropriate.   No visits with results within  3 Day(s) from this visit.  Latest known visit with results is:  Appointment on 12/12/2017  Component Date Value Ref Range Status  . Iron 12/12/2017 59  28 - 170 ug/dL Final  . TIBC 16/10/960412/11/2017 291  250 - 450 ug/dL Final  . Saturation Ratios 12/12/2017 20  10.4 - 31.8 % Final  . UIBC 12/12/2017 232  ug/dL Final   Performed at [facility], [address] [place], [place] [address]  . Ferritin 12/12/2017 73  11 - 307 ng/mL Final   Performed at [facility], [address] [place], [place] [address]  . WBC 12/12/2017 7.6  4.0 - 10.5 K/uL Final  . RBC 12/12/2017 5.44* 3.87 - 5.11 MIL/uL Final  . Hemoglobin 12/12/2017 15.7* 12.0 - 15.0 g/dL Final  . HCT 78/29/562112/11/2017 46.8* 36.0 - 46.0 % Final  . MCV 12/12/2017 86.0  80.0 - 100.0 fL Final  . MCH 12/12/2017 28.9  26.0 - 34.0 pg Final  . MCHC 12/12/2017  33.5  30.0 - 36.0 g/dL Final  . RDW 32/44/0102 13.2  11.5 - 15.5 % Final  . Platelets 12/12/2017 227  150 - 400 K/uL Final  . nRBC 12/12/2017 0.0  0.0 - 0.2 % Final  . Neutrophils Relative % 12/12/2017 66  % Final  . Neutro Abs 12/12/2017 5.2  1.7 - 7.7 K/uL Final  . Lymphocytes Relative 12/12/2017 22  % Final  . Lymphs Abs 12/12/2017 1.6  0.7 - 4.0 K/uL Final  . Monocytes Relative 12/12/2017 7  % Final  . Monocytes Absolute 12/12/2017 0.5  0.1 - 1.0 K/uL Final  . Eosinophils Relative 12/12/2017 3  % Final  . Eosinophils Absolute 12/12/2017 0.2  0.0 - 0.5 K/uL Final  . Basophils Relative 12/12/2017 1  % Final  . Basophils Absolute 12/12/2017 0.0  0.0 - 0.1 K/uL Final  . Immature Granulocytes 12/12/2017 1  % Final  . Abs Immature Granulocytes 12/12/2017 0.05  0.00 - 0.07 K/uL Final   Performed at [facility], [address], [place], [place] [address]    Assessment:  Erin Howe is a 48 y.o. female s/p gastric bypass surgery with subsequent iron deficiency and B12 deficiency.  She has iron deficiency anemia.  She has received Feraheme (12/03/2015,  12/09/2015, 05/16/2016, 05/24/2016, 12/07/2016, 12/29/2016).  Ferritin has been followed: 11.1 on 11/25/2008, 2.6 on 10/08/2012, 2 on 11/24/2015, 17 on 01/05/2016, 6 on 05/08/2016, 19 on 11/22/2016, 97 on 03/28/2017, 106 on 06/15/2017, 84 on 09/17/2017, 73 on 12/12/2017.  She receives B12 monthly.  B12 was 210 on 11/25/2008, 345 on 09/03/2009, 408 on 08/22/2010, 413 on 10/09/2011, 804 on 10/08/2012, 872 on 11/03/2013, 405 on 03/28/2017, and 545 on 06/15/2017.  Folate was 13.1 on 11/25/2008, 6.6 on 09/03/2009, 7.0 on 03/28/2017.   Plan: 1.  Labs today:  CBC with diff, ferritin, iron studies, folate, sed rate. 2.  Iron deficiency anemia:  S/p gastric bypass surgery. 3.   4.    Rosey Bath, MD  01/12/2018, 1:03 PM   I saw and evaluated the patient, participating in the key portions of the service and reviewing pertinent diagnostic studies and records.  I reviewed the nurse practitioner's note and agree with the findings and the plan.  The assessment and plan were discussed with the patient.  Additional diagnostic studies of *** are needed to clarify *** and would change the clinical management.  A few ***multiple questions were asked by the patient and answered.   Nelva Nay, MD 01/12/2018,1:03 PM

## 2018-01-14 ENCOUNTER — Inpatient Hospital Stay: Payer: 59 | Admitting: Hematology and Oncology

## 2018-01-28 ENCOUNTER — Other Ambulatory Visit: Payer: 59

## 2018-01-30 ENCOUNTER — Inpatient Hospital Stay: Payer: 59 | Attending: Oncology | Admitting: Hematology and Oncology

## 2018-01-30 ENCOUNTER — Inpatient Hospital Stay: Payer: 59

## 2018-01-30 DIAGNOSIS — E538 Deficiency of other specified B group vitamins: Secondary | ICD-10-CM | POA: Insufficient documentation

## 2018-01-30 NOTE — Progress Notes (Deleted)
[facility]-  Cancer Center  Clinic day:  01/30/2018  Chief Complaint: Erin Howe is a 48 y.o. female with iron deficiency anemia and B12 deficiency who is seen for new patient assessment.  HPI: ***  Seen by Dr Smith Robert on 06/18/2017:  who was previously seen by Dr. Bertis Ruddy for iron deficiency anemia. She had normal CBC up until 2011. She then began to have progressive microcytic anemia starting 2012. Her hemoglobin was 8 with an MCV of 64 in August 2017. Ferritin was low at 3. She also has B12 deficiency and self-administered B12 injections at home. No routine use of NSAIDs. She has been on oral iron in the past with no improvement in her anemia. She does have a history of gastric bypass surgery in the past. Last dose of IV iron was in December 2017.   She was last seen by Dr Smith Robert on 06/18/2017.  Interval history- reports being under stress from working 2 jobs. She has not been sleeping well and feels fatigued.  xxx  with iron deficiency anemia likely due to gastric bypass  She is not anemic today and iron studies are normal. She last received IV iron 6 months ago. Her fatigue is not due to iron deficiency. She is on b12 shots  Repeat cbc ferritin and iron studies in 3 and 6 months. I will see her back in 6 months  xxx  She has received Feraheme (12/03/2015, 12/09/2015, 05/16/2016, 05/24/2016, 12/07/2016, 12/29/2016).  Ferritin has been followed: 11.1 on 11/25/2008, 2.6 on 10/08/2012, 2 on 11/24/2015, 17 on 01/05/2016, 6 on 05/08/2016, 19 on 11/22/2016, 97 on 03/28/2017, 106 on 06/15/2017, 84 on 09/17/2017, 73 on 12/12/2017.  B12 was 210 on 11/25/2008, 345 on 09/03/2009, 408 on 08/22/2010, 413 on 10/09/2011, 804 on 10/08/2012, 872 on 11/03/2013, 405 on 03/28/2017, and 545 on 06/15/2017.  Folate was 13.1 on 11/25/2008, 6.6 on 09/03/2009, 7.0 on 03/28/2017.   Past Medical History:  Diagnosis Date  . Anemia     Past Surgical History:  Procedure  Laterality Date  . CHOLECYSTECTOMY    . GASTRIC BYPASS  02-03-08  . REFRACTIVE SURGERY  2006  . WISDOM TOOTH EXTRACTION      Family History  Problem Relation Age of Onset  . Atrial fibrillation Mother   . Heart failure Mother   . Deep vein thrombosis Mother   . Diabetes Mother   . Hyperlipidemia Mother   . Hypertension Mother   . Hyperlipidemia Father   . Hypertension Father   . Diabetes Sister   . Stroke Maternal Grandfather   . Heart disease Maternal Grandmother   . Breast cancer Cousin     Social History:  reports that she has never smoked. She has quit using smokeless tobacco. She reports that she does not drink alcohol or use drugs.  The patient is accompanied by *** alone today.  Allergies:  Allergies  Allergen Reactions  . Amoxicillin   . Penicillins Swelling  . Tetanus Toxoid   . Tetanus Toxoids     Current Medications: Current Outpatient Medications  Medication Sig Dispense Refill  . amphetamine-dextroamphetamine (ADDERALL XR) 30 MG 24 hr capsule Take by mouth.    Marland Kitchen amphetamine-dextroamphetamine (ADDERALL) 20 MG tablet 1 po q afternoon 90 tablet 0  . clonazePAM (KLONOPIN) 0.5 MG tablet TAKE 1 TABLET BY MOUTH NIGHTLY AS NEEDED FOR ANXIETY  2  . cyanocobalamin (,VITAMIN B-12,) 1000 MCG/ML injection INJECT 1 ML (1,000 MCG TOTAL) INTO THE SKIN EVERY 30 (THIRTY) DAYS. 1  mL 11  . cyclobenzaprine (FLEXERIL) 5 MG tablet Take 1-2 tablets (5-10 mg total) by mouth 3 (three) times daily as needed for muscle spasms. 20 tablet 0  . loratadine (CLARITIN) 10 MG tablet Take 1 tablet (10 mg total) by mouth daily. 30 tablet 11  . meloxicam (MOBIC) 7.5 MG tablet     . omeprazole (PRILOSEC) 40 MG capsule Take 1 capsule (40 mg total) by mouth daily. 90 capsule 3  . oxyCODONE-acetaminophen (ROXICET) 5-325 MG tablet Take 1 tablet by mouth every 6 (six) hours as needed for severe pain. (Patient not taking: Reported on 06/18/2017) 20 tablet 0  . Vitamin D, Ergocalciferol, (DRISDOL) 50000  units CAPS capsule TAKE ONE CAPSULE BY MOUTH WEEKLY AS ONE DOSE 12 capsule 0   No current facility-administered medications for this visit.     Review of Systems:  GENERAL:  Feels good.  Active.  No fevers, sweats or weight loss. PERFORMANCE STATUS (ECOG):  *** HEENT:  No visual changes, runny nose, sore throat, mouth sores or tenderness. Lungs: No shortness of breath or cough.  No hemoptysis. Cardiac:  No chest pain, palpitations, orthopnea, or PND. GI:  No nausea, vomiting, diarrhea, constipation, melena or hematochezia. GU:  No urgency, frequency, dysuria, or hematuria. Musculoskeletal:  No back pain.  No joint pain.  No muscle tenderness. Extremities:  No pain or swelling. Skin:  No rashes or skin changes. Neuro:  No headache, numbness or weakness, balance or coordination issues. Endocrine:  No diabetes, thyroid issues, hot flashes or night sweats. Psych:  No mood changes, depression or anxiety. Pain:  No focal pain. Review of systems:  All other systems reviewed and found to be negative.  Physical Exam: There were no vitals taken for this visit. GENERAL:  Well developed, well nourished, **man sitting comfortably in the exam room in no acute distress. MENTAL STATUS:  Alert and oriented to person, place and time. HEAD:  *** hair.  Normocephalic, atraumatic, face symmetric, no Cushingoid features. EYES:  *** eyes.  Pupils equal round and reactive to Hreha and accomodation.  No conjunctivitis or scleral icterus. ENT:  Oropharynx clear without lesion.  Tongue normal. Mucous membranes moist.  RESPIRATORY:  Clear to auscultation without rales, wheezes or rhonchi. CARDIOVASCULAR:  Regular rate and rhythm without murmur, rub or gallop. ABDOMEN:  Soft, non-tender, with active bowel sounds, and no hepatosplenomegaly.  No masses. SKIN:  No rashes, ulcers or lesions. EXTREMITIES: No edema, no skin discoloration or tenderness.  No palpable cords. LYMPH NODES: No palpable cervical,  supraclavicular, axillary or inguinal adenopathy  NEUROLOGICAL: Unremarkable. PSYCH:  Appropriate.   No visits with results within 3 Day(s) from this visit.  Latest known visit with results is:  Appointment on 12/12/2017  Component Date Value Ref Range Status  . Iron 12/12/2017 59  28 - 170 ug/dL Final  . TIBC 96/04/540912/11/2017 291  250 - 450 ug/dL Final  . Saturation Ratios 12/12/2017 20  10.4 - 31.8 % Final  . UIBC 12/12/2017 232  ug/dL Final   Performed at [facility], [address] [place], [place] [address]  . Ferritin 12/12/2017 73  11 - 307 ng/mL Final   Performed at [facility], [address] [place], [place] [address]  . WBC 12/12/2017 7.6  4.0 - 10.5 K/uL Final  . RBC 12/12/2017 5.44* 3.87 - 5.11 MIL/uL Final  . Hemoglobin 12/12/2017 15.7* 12.0 - 15.0 g/dL Final  . HCT 56/21/308612/11/2017 46.8* 36.0 - 46.0 % Final  . MCV 12/12/2017  86.0  80.0 - 100.0 fL Final  . MCH 12/12/2017 28.9  26.0 - 34.0 pg Final  . MCHC 12/12/2017 33.5  30.0 - 36.0 g/dL Final  . RDW 40/98/119112/11/2017 13.2  11.5 - 15.5 % Final  . Platelets 12/12/2017 227  150 - 400 K/uL Final  . nRBC 12/12/2017 0.0  0.0 - 0.2 % Final  . Neutrophils Relative % 12/12/2017 66  % Final  . Neutro Abs 12/12/2017 5.2  1.7 - 7.7 K/uL Final  . Lymphocytes Relative 12/12/2017 22  % Final  . Lymphs Abs 12/12/2017 1.6  0.7 - 4.0 K/uL Final  . Monocytes Relative 12/12/2017 7  % Final  . Monocytes Absolute 12/12/2017 0.5  0.1 - 1.0 K/uL Final  . Eosinophils Relative 12/12/2017 3  % Final  . Eosinophils Absolute 12/12/2017 0.2  0.0 - 0.5 K/uL Final  . Basophils Relative 12/12/2017 1  % Final  . Basophils Absolute 12/12/2017 0.0  0.0 - 0.1 K/uL Final  . Immature Granulocytes 12/12/2017 1  % Final  . Abs Immature Granulocytes 12/12/2017 0.05  0.00 - 0.07 K/uL Final   Performed at [facility], [address] [place], [place] [address]    Assessment:  Helane RimaJoanne Smith Needs is a 48 y.o. female s/p gastric bypass  surgery with subsequent iron deficiency and B12 deficiency.  She has iron deficiency anemia.  She has received Feraheme (12/03/2015, 12/09/2015, 05/16/2016, 05/24/2016, 12/07/2016, 12/29/2016).  Ferritin has been followed: 11.1 on 11/25/2008, 2.6 on 10/08/2012, 2 on 11/24/2015, 17 on 01/05/2016, 6 on 05/08/2016, 19 on 11/22/2016, 97 on 03/28/2017, 106 on 06/15/2017, 84 on 09/17/2017, 73 on 12/12/2017.  She receives B12 monthly.  B12 was 210 on 11/25/2008, 345 on 09/03/2009, 408 on 08/22/2010, 413 on 10/09/2011, 804 on 10/08/2012, 872 on 11/03/2013, 405 on 03/28/2017, and 545 on 06/15/2017.  Folate was 13.1 on 11/25/2008, 6.6 on 09/03/2009, 7.0 on 03/28/2017.   Plan: 1.  Labs today:  CBC with diff, ferritin, iron studies, folate, sed rate. 2.  Iron deficiency anemia:  S/p gastric bypass surgery. 3.   4.    Rosey BathMelissa C , MD  01/30/2018, 5:26 AM   I saw and evaluated the patient, participating in the key portions of the service and reviewing pertinent diagnostic studies and records.  I reviewed the nurse practitioner's note and agree with the findings and the plan.  The assessment and plan were discussed with the patient.  Additional diagnostic studies of *** are needed to clarify *** and would change the clinical management.  A few ***multiple questions were asked by the patient and answered.   Nelva NayMelissa , MD 01/30/2018,5:26 AM

## 2018-05-05 DIAGNOSIS — R300 Dysuria: Secondary | ICD-10-CM | POA: Diagnosis not present

## 2018-05-21 DIAGNOSIS — E119 Type 2 diabetes mellitus without complications: Secondary | ICD-10-CM | POA: Diagnosis not present
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
## 2018-07-09 DIAGNOSIS — R21 Rash and other nonspecific skin eruption: Secondary | ICD-10-CM | POA: Diagnosis not present

## 2018-07-17 ENCOUNTER — Other Ambulatory Visit: Payer: Self-pay

## 2018-07-17 ENCOUNTER — Inpatient Hospital Stay: Payer: 59 | Attending: Hematology and Oncology

## 2018-07-17 DIAGNOSIS — D509 Iron deficiency anemia, unspecified: Secondary | ICD-10-CM

## 2018-07-17 DIAGNOSIS — R5381 Other malaise: Secondary | ICD-10-CM | POA: Insufficient documentation

## 2018-07-17 DIAGNOSIS — Z791 Long term (current) use of non-steroidal anti-inflammatories (NSAID): Secondary | ICD-10-CM | POA: Insufficient documentation

## 2018-07-17 DIAGNOSIS — R05 Cough: Secondary | ICD-10-CM | POA: Insufficient documentation

## 2018-07-17 DIAGNOSIS — E538 Deficiency of other specified B group vitamins: Secondary | ICD-10-CM

## 2018-07-17 DIAGNOSIS — D508 Other iron deficiency anemias: Secondary | ICD-10-CM | POA: Diagnosis not present

## 2018-07-17 DIAGNOSIS — Z79899 Other long term (current) drug therapy: Secondary | ICD-10-CM | POA: Diagnosis not present

## 2018-07-17 DIAGNOSIS — E559 Vitamin D deficiency, unspecified: Secondary | ICD-10-CM | POA: Diagnosis not present

## 2018-07-17 DIAGNOSIS — R5383 Other fatigue: Secondary | ICD-10-CM | POA: Insufficient documentation

## 2018-07-17 DIAGNOSIS — R252 Cramp and spasm: Secondary | ICD-10-CM | POA: Insufficient documentation

## 2018-07-17 DIAGNOSIS — Z9884 Bariatric surgery status: Secondary | ICD-10-CM | POA: Insufficient documentation

## 2018-07-17 LAB — FOLATE: Folate: 8.8 ng/mL (ref >5.9)

## 2018-07-17 LAB — CBC WITH DIFFERENTIAL/PLATELET
Abs Immature Granulocytes: 0.07 10*3/uL (ref 0.00–0.07)
Basophils Absolute: 0 10*3/uL (ref 0.0–0.1)
Basophils Relative: 1 %
Eosinophils Absolute: 0.3 10*3/uL (ref 0.0–0.5)
Eosinophils Relative: 3 %
HCT: 46.2 % — ABNORMAL HIGH (ref 36.0–46.0)
Hemoglobin: 15.7 g/dL — ABNORMAL HIGH (ref 12.0–15.0)
Immature Granulocytes: 1 %
Lymphocytes Relative: 19 %
Lymphs Abs: 1.6 10*3/uL (ref 0.7–4.0)
MCH: 29.3 pg (ref 26.0–34.0)
MCHC: 34 g/dL (ref 30.0–36.0)
MCV: 86.2 fL (ref 80.0–100.0)
Monocytes Absolute: 0.5 10*3/uL (ref 0.1–1.0)
Monocytes Relative: 7 %
Neutro Abs: 5.7 10*3/uL (ref 1.7–7.7)
Neutrophils Relative %: 69 %
Platelets: 229 10*3/uL (ref 150–400)
RBC: 5.36 MIL/uL — ABNORMAL HIGH (ref 3.87–5.11)
RDW: 13.2 % (ref 11.5–15.5)
WBC: 8.2 10*3/uL (ref 4.0–10.5)
nRBC: 0 % (ref 0.0–0.2)

## 2018-07-17 LAB — IRON AND TIBC
Iron: 70 ug/dL (ref 28–170)
Saturation Ratios: 23 % (ref 10.4–31.8)
TIBC: 305 ug/dL (ref 250–450)
UIBC: 235 ug/dL

## 2018-07-17 LAB — FERRITIN: Ferritin: 65 ng/mL (ref 11–307)

## 2018-07-17 LAB — SEDIMENTATION RATE: Sed Rate: 4 mm/hr (ref 0–30)

## 2018-07-17 NOTE — Progress Notes (Signed)
[facility]  [address], [address] [place], [place] [address] Phone: [phone]  Fax: [phone]   Clinic Day:  07/19/2018  Referring physician: Rayetta HumphreyGeorge, Sionne A, MD  Chief Complaint: Erin Howe is a 48 y.o. female s/p gastric bypass surgery with iron deficiency anemia, Vitamin D deficiency, and B-12 deficiency who is seen for new patient assessment.  HPI:  The patient underwent gastric bypass surgery in 2010. She has lost over 200 pounds. She was diagnosed with progressive microcytic iron deficiency anemia and B-12 deficiency in 2012.  She was initially seen by Dr. Bertis RuddyGorsuch on 11/24/2015. She was subsequently followed by Dr. Smith Robertao. She was on oral iron daily. Hemoglobin was 8.0, MCV 64, and ferritin 3.   She has received B-12 injections at home for several years. Oral iron was discontinued. She started IV iron therapy with Feraheme. Vitamin D deficiency was followed by her PCP.  She has received Feraheme x2 (12/03/2015-12/09/2015), x2 (05/16/2016-05/24/2016), x1 on 12/07/2016, and x1 on 12/29/2016.   Ferritin has been followed: 2 on 11/25/2014, 3 on 03/23/2015, 5 on 05/24/2015, 3 on 08/25/2015, 2 on 11/24/2015, 17 on 01/05/2016, 6 on 05/08/2016, 19 on 11/22/2016, 97 on 03/28/2017, 106 on 06/15/2017, 84 on 09/17/2017, 73 on 12/12/2017, and 65 on 07/17/2018.   The patient was last seen in the medical oncology clinic on 06/18/2017 by Dr. Smith Robertao. At that time, she was stressed from working two jobs. She was fatigued and had not been sleeping well. Hemoglobin 15.9, hematocrit 46.9, iron saturation 21%, ferritin 106, B-12 545.   Labs followed: 09/17/2017: Hemoglobin 15.0, hematocrit 44.8, iron saturation 22%, ferritin 84. 12/12/2017: Hemoglobin 15.7, hematocrit 46.8, iron saturation 20%, ferritin 73. 07/17/2018: Hemoglobin 15.7, hematocrit 46.2, iron saturation 23%, ferritin 65. Folate 8.8. Sed rate 4.   During the interim, she is doing well. She denies any  fevers, sweats, or weight loss. She has an occasional cough, which she attributes to large tonsils. She denies any chest pain or shortness of breath. She denies any abdominal or urinary symptoms. She reports fatigue and naps often even if she sleeps 8-9 hours per night. She has never undergone a sleep study to see if she has sleep apnea.   Her diet is "okay." She tries to eat iron rich foods. She denies any ice pica. She has cramps in her legs. She notes improvement after receiving IV iron.    Past Medical History:  Diagnosis Date  . Anemia     Past Surgical History:  Procedure Laterality Date  . CHOLECYSTECTOMY    . GASTRIC BYPASS  02-03-08  . REFRACTIVE SURGERY  2006  . WISDOM TOOTH EXTRACTION      Family History  Problem Relation Age of Onset  . Atrial fibrillation Mother   . Heart failure Mother   . Deep vein thrombosis Mother   . Diabetes Mother   . Hyperlipidemia Mother   . Hypertension Mother   . Hyperlipidemia Father   . Hypertension Father   . Diabetes Sister   . Stroke Maternal Grandfather   . Heart disease Maternal Grandmother   . Breast cancer Cousin     Social History:  reports that she has never smoked. She has quit using smokeless tobacco. She reports that she does not drink alcohol or use drugs. She does not smoke or drink alcohol. She denies any known exposures to radiations or toxins. She works full-time for [employer] (from home) and part-time for [employer]. [place] in [place]. The patient  is alone today.  Allergies:  Allergies  Allergen Reactions  . Other Other (See Comments)    Walnuts and Pecans;  History of Gastric Bypass pass  . Amoxicillin   . Penicillins Swelling  . Tetanus Toxoid   . Tetanus Toxoids     Current Medications: Current Outpatient Medications  Medication Sig Dispense Refill  . amphetamine-dextroamphetamine (ADDERALL XR) 30 MG 24 hr capsule Take 30 mg by mouth daily.     . amphetamine-dMarland Kitchenextroamphetamine (ADDERALL)  20 MG tablet 1 po q afternoon 90 tablet 0  . clonazePAM (KLONOPIN) 0.5 MG tablet TAKE 1 TABLET BY MOUTH NIGHTLY AS NEEDED FOR ANXIETY  2  . cyanocobalamin (,VITAMIN B-12,) 1000 MCG/ML injection INJECT 1 ML (1,000 MCG TOTAL) INTO THE SKIN EVERY 30 (THIRTY) DAYS. (Patient taking differently: Inject 1,000 mcg into the skin once a week. ) 1 mL 11  . cyclobenzaprine (FLEXERIL) 5 MG tablet Take 1-2 tablets (5-10 mg total) by mouth 3 (three) times daily as needed for muscle spasms. 20 tablet 0  . meloxicam (MOBIC) 7.5 MG tablet Take 7.5 mg by mouth as needed.     . nystatin cream (MYCOSTATIN) Apply 1 application topically as needed.     Marland Kitchen. omeprazole (PRILOSEC) 40 MG capsule Take 1 capsule (40 mg total) by mouth daily. 90 capsule 3  . albuterol (PROVENTIL) (2.5 MG/3ML) 0.083% nebulizer solution Inhale 2.5 mg into the lungs every 4 (four) hours as needed.     . loratadine (CLARITIN) 10 MG tablet Take 1 tablet (10 mg total) by mouth daily. (Patient not taking: Reported on 07/19/2018) 30 tablet 11  . Vitamin D, Ergocalciferol, (DRISDOL) 50000 units CAPS capsule TAKE ONE CAPSULE BY MOUTH WEEKLY AS ONE DOSE (Patient not taking: Reported on 07/19/2018) 12 capsule 0   No current facility-administered medications for this visit.     Review of Systems  Constitutional: Positive for malaise/fatigue. Negative for chills, diaphoresis, fever and weight loss.  HENT: Negative.  Negative for congestion, hearing loss, sinus pain and sore throat.   Eyes: Negative.  Negative for blurred vision.  Respiratory: Positive for cough (occasional). Negative for shortness of breath and wheezing.   Cardiovascular: Negative.  Negative for chest pain, palpitations, orthopnea, leg swelling and PND.  Gastrointestinal: Negative.  Negative for abdominal pain, blood in stool, constipation, diarrhea, melena, nausea and vomiting.       No ice pica  Genitourinary: Negative.  Negative for dysuria, frequency, hematuria and urgency.   Musculoskeletal: Negative.  Negative for back pain, joint pain and myalgias.       Leg cramps  Skin: Negative.  Negative for rash.  Neurological: Negative.  Negative for dizziness, tingling, sensory change, weakness and headaches.  Endo/Heme/Allergies: Negative.  Does not bruise/bleed easily.  Psychiatric/Behavioral: Negative for depression, memory loss and substance abuse. The patient is not nervous/anxious and does not have insomnia.   All other systems reviewed and are negative.  Performance status (ECOG): 2  Vitals Blood pressure 124/82, pulse 81, temperature 98.2 F (36.8 C), temperature source Oral, resp. rate 16, weight 248 lb 0.3 oz (112.5 kg), SpO2 99 %.   Physical Exam  Constitutional: She is oriented to person, place, and time. She appears well-developed and well-nourished. No distress.  HENT:  Head: Normocephalic and atraumatic.  Mouth/Throat: Oropharynx is clear and moist. No oropharyngeal exudate.  Reddish-blonde hair pulled back. Mask.  Eyes: Pupils are equal, round, and reactive to [place]. Conjunctivae and EOM are normal. No scleral icterus.  Neck: Normal range of motion. Neck  supple.  Cardiovascular: Normal rate, regular rhythm and normal heart sounds.  No murmur heard. Pulmonary/Chest: Effort normal and breath sounds normal. No respiratory distress. She has no wheezes.  Abdominal: Soft. Bowel sounds are normal. She exhibits no distension. There is no hepatosplenomegaly. There is no abdominal tenderness.  Musculoskeletal: Normal range of motion.        General: No edema.  Lymphadenopathy:    She has no cervical adenopathy.    She has no axillary adenopathy.       Right: No supraclavicular adenopathy present.       Left: No supraclavicular adenopathy present.  Neurological: She is alert and oriented to person, place, and time.  Skin: Skin is warm and dry. She is not diaphoretic.  Psychiatric: She has a normal mood and affect. Her behavior is normal. Judgment and  thought content normal.  Nursing note and vitals reviewed.   Appointment on 07/17/2018  Component Date Value Ref Range Status  . Folate 07/17/2018 8.8  >5.9 ng/mL Final   Performed at [facility], [address] [place], [place] [address]  . Sed Rate 07/17/2018 4  0 - 30 mm/hr Final   Performed at [facility], [address] [place], [place] [address]  . Iron 07/17/2018 70  28 - 170 ug/dL Final  . TIBC 82/95/621307/15/2020 305  250 - 450 ug/dL Final  . Saturation Ratios 07/17/2018 23  10.4 - 31.8 % Final  . UIBC 07/17/2018 235  ug/dL Final   Performed at [facility], [address] [place], [place] [address]  . Ferritin 07/17/2018 65  11 - 307 ng/mL Final   Performed at [facility], [address] [place], [place] [address]  . WBC 07/17/2018 8.2  4.0 - 10.5 K/uL Final  . RBC 07/17/2018 5.36* 3.87 - 5.11 MIL/uL Final  . Hemoglobin 07/17/2018 15.7* 12.0 - 15.0 g/dL Final  . HCT 29/52/841307/15/2020 46.2* 36.0 - 46.0 % Final  . MCV 07/17/2018 86.2  80.0 - 100.0 fL Final  . MCH 07/17/2018 29.3  26.0 - 34.0 pg Final  . MCHC 07/17/2018 34.0  30.0 - 36.0 g/dL Final  . RDW 24/40/102707/15/2020 13.2  11.5 - 15.5 % Final  . Platelets 07/17/2018 229  150 - 400 K/uL Final  . nRBC 07/17/2018 0.0  0.0 - 0.2 % Final  . Neutrophils Relative % 07/17/2018 69  % Final  . Neutro Abs 07/17/2018 5.7  1.7 - 7.7 K/uL Final  . Lymphocytes Relative 07/17/2018 19  % Final  . Lymphs Abs 07/17/2018 1.6  0.7 - 4.0 K/uL Final  . Monocytes Relative 07/17/2018 7  % Final  . Monocytes Absolute 07/17/2018 0.5  0.1 - 1.0 K/uL Final  . Eosinophils Relative 07/17/2018 3  % Final  . Eosinophils Absolute 07/17/2018 0.3  0.0 - 0.5 K/uL Final  . Basophils Relative 07/17/2018 1  % Final  . Basophils Absolute 07/17/2018 0.0  0.0 - 0.1 K/uL Final  . Immature Granulocytes 07/17/2018 1  % Final  . Abs Immature Granulocytes 07/17/2018 0.07  0.00 - 0.07 K/uL Final   Performed at [facility]  [facility], [address] [place], [place] [address]    Assessment:  Erin Howe is a 48 y.o. female s/p gastric bypass surgery (2011) with iron deficiency anemia and B12 deficiency.  She developed microcytic anemia in 2012.  She has received Feraheme x2 (12/03/2015-12/09/2015), x2 (05/16/2016-05/24/2016), x1 on 12/07/2016, and x1 12/29/2016.   Ferritin has been followed:  2 on 11/25/2014, 3 on 03/23/2015, 5 on 05/24/2015, 3 on 08/25/2015, 2 on 11/24/2015, 17 on 01/05/2016, 6 on 05/08/2016, 19 on 11/22/2016, 97 on 03/28/2017, 106 on 06/15/2017, 84 on 09/17/2017, 73 on 12/12/2017, and 65 on 07/17/2018.   She has receives B12 injections at home.  B12 was 210 on 11/25/2008 and 545 on 06/15/2017.  Folate was 8.8 on 07/17/2018.  Symptomatically, she is fatigued.  She denies any bleeding.  Exam is unremarkable.  Plan: 1.   Review labs from 07/17/2018.  2.   Iron deficiency anemia s/p gastric bypass  Review entire medical history, diagnosis and management of iron deficiency anemia.   Discuss ongoing need for IV iron.  Discuss IV iron if ferritin < 30, microcytic RBC indices or symptomatic.  Hematocrit 46.2.  Hemoglobin 15.7.  MCV 86.2.  Ferritin 65.  3.   B12 deficiency  She receives B12 at home.  Check folate periodically. 4.   Fatigue  Etiology unclear.  Check thyroid function.  Consider evaluation for sleep apnea.  5.   RTC in 3 months for labs (CBC with diff, ferritin). 6.   RTC in 6 months for MD assessment, labs (CBC with diff, ferritin-day before), and +/- Feraheme.  I discussed the assessment and treatment plan with the patient.  The patient was provided an opportunity to ask questions and all were answered.  The patient agreed with the plan and demonstrated an understanding of the instructions.  The patient was advised to call back if the symptoms worsen or if the condition fails to improve as anticipated.    Lequita Asal, MD, PhD    07/19/2018, 2:13 PM  I, Molly Dorshimer,  am acting as Education administrator for [employer]. Mike Gip, MD, PhD.  I, Melissa C. Mike Gip, MD, have reviewed the above documentation for accuracy and completeness, and I agree with the above.

## 2018-07-18 ENCOUNTER — Other Ambulatory Visit: Payer: Self-pay | Admitting: Hematology and Oncology

## 2018-07-19 ENCOUNTER — Encounter: Payer: Self-pay | Admitting: Hematology and Oncology

## 2018-07-19 ENCOUNTER — Inpatient Hospital Stay: Payer: 59

## 2018-07-19 ENCOUNTER — Other Ambulatory Visit: Payer: Self-pay

## 2018-07-19 ENCOUNTER — Inpatient Hospital Stay (HOSPITAL_BASED_OUTPATIENT_CLINIC_OR_DEPARTMENT_OTHER): Payer: 59 | Admitting: Hematology and Oncology

## 2018-07-19 VITALS — BP 124/82 | HR 81 | Temp 98.2°F | Resp 16 | Wt 248.0 lb

## 2018-07-19 DIAGNOSIS — K9589 Other complications of other bariatric procedure: Secondary | ICD-10-CM

## 2018-07-19 DIAGNOSIS — Z9884 Bariatric surgery status: Secondary | ICD-10-CM | POA: Diagnosis not present

## 2018-07-19 DIAGNOSIS — E538 Deficiency of other specified B group vitamins: Secondary | ICD-10-CM | POA: Diagnosis not present

## 2018-07-19 DIAGNOSIS — R5383 Other fatigue: Secondary | ICD-10-CM | POA: Diagnosis not present

## 2018-07-19 DIAGNOSIS — E559 Vitamin D deficiency, unspecified: Secondary | ICD-10-CM | POA: Diagnosis not present

## 2018-07-19 DIAGNOSIS — R05 Cough: Secondary | ICD-10-CM | POA: Diagnosis not present

## 2018-07-19 DIAGNOSIS — Z79899 Other long term (current) drug therapy: Secondary | ICD-10-CM | POA: Diagnosis not present

## 2018-07-19 DIAGNOSIS — R252 Cramp and spasm: Secondary | ICD-10-CM

## 2018-07-19 DIAGNOSIS — D509 Iron deficiency anemia, unspecified: Secondary | ICD-10-CM

## 2018-07-19 DIAGNOSIS — R5381 Other malaise: Secondary | ICD-10-CM

## 2018-07-19 DIAGNOSIS — Z791 Long term (current) use of non-steroidal anti-inflammatories (NSAID): Secondary | ICD-10-CM

## 2018-07-19 DIAGNOSIS — D508 Other iron deficiency anemias: Secondary | ICD-10-CM | POA: Diagnosis not present

## 2018-07-19 NOTE — Progress Notes (Signed)
Pt here for follow up. Previous Erin Howe patient. Denies any concerns.

## 2018-08-09 DIAGNOSIS — R69 Illness, unspecified: Secondary | ICD-10-CM | POA: Diagnosis not present

## 2018-08-23 DIAGNOSIS — K0381 Cracked tooth: Secondary | ICD-10-CM | POA: Diagnosis not present

## 2018-09-11 DIAGNOSIS — M545 Low back pain: Secondary | ICD-10-CM | POA: Diagnosis not present

## 2018-09-11 DIAGNOSIS — E119 Type 2 diabetes mellitus without complications: Secondary | ICD-10-CM | POA: Diagnosis not present

## 2018-09-11 DIAGNOSIS — R109 Unspecified abdominal pain: Secondary | ICD-10-CM | POA: Diagnosis not present

## 2018-09-11 DIAGNOSIS — Z23 Encounter for immunization: Secondary | ICD-10-CM | POA: Diagnosis not present

## 2018-09-11 DIAGNOSIS — D500 Iron deficiency anemia secondary to blood loss (chronic): Secondary | ICD-10-CM | POA: Diagnosis not present

## 2018-09-11 DIAGNOSIS — M5386 Other specified dorsopathies, lumbar region: Secondary | ICD-10-CM | POA: Diagnosis not present

## 2018-09-11 DIAGNOSIS — R300 Dysuria: Secondary | ICD-10-CM | POA: Diagnosis not present

## 2018-11-06 DIAGNOSIS — Z79899 Other long term (current) drug therapy: Secondary | ICD-10-CM | POA: Diagnosis not present

## 2018-11-06 DIAGNOSIS — D500 Iron deficiency anemia secondary to blood loss (chronic): Secondary | ICD-10-CM | POA: Diagnosis not present

## 2018-11-06 DIAGNOSIS — E538 Deficiency of other specified B group vitamins: Secondary | ICD-10-CM | POA: Diagnosis not present

## 2018-11-06 DIAGNOSIS — R69 Illness, unspecified: Secondary | ICD-10-CM | POA: Diagnosis not present

## 2018-11-06 DIAGNOSIS — N912 Amenorrhea, unspecified: Secondary | ICD-10-CM | POA: Diagnosis not present

## 2018-11-06 DIAGNOSIS — E559 Vitamin D deficiency, unspecified: Secondary | ICD-10-CM | POA: Diagnosis not present

## 2018-11-06 DIAGNOSIS — H35011 Changes in retinal vascular appearance, right eye: Secondary | ICD-10-CM | POA: Diagnosis not present

## 2018-11-07 ENCOUNTER — Other Ambulatory Visit: Payer: Self-pay | Admitting: Oncology

## 2018-11-07 DIAGNOSIS — E538 Deficiency of other specified B group vitamins: Secondary | ICD-10-CM

## 2018-12-06 DIAGNOSIS — R69 Illness, unspecified: Secondary | ICD-10-CM | POA: Diagnosis not present

## 2019-01-23 DIAGNOSIS — R69 Illness, unspecified: Secondary | ICD-10-CM | POA: Diagnosis not present

## 2019-04-30 DIAGNOSIS — R69 Illness, unspecified: Secondary | ICD-10-CM | POA: Diagnosis not present

## 2019-07-03 DIAGNOSIS — R69 Illness, unspecified: Secondary | ICD-10-CM | POA: Diagnosis not present

## 2019-07-21 IMAGING — CR DG HUMERUS 2V *L*
2 series · 2 of 2 positions shown · non-contrast
Comparison: None.

CLINICAL DATA: Left arm pain after slip on ice.

EXAM:
LEFT HUMERUS - 2+ VIEW

[humerus lat (1 of 2)]
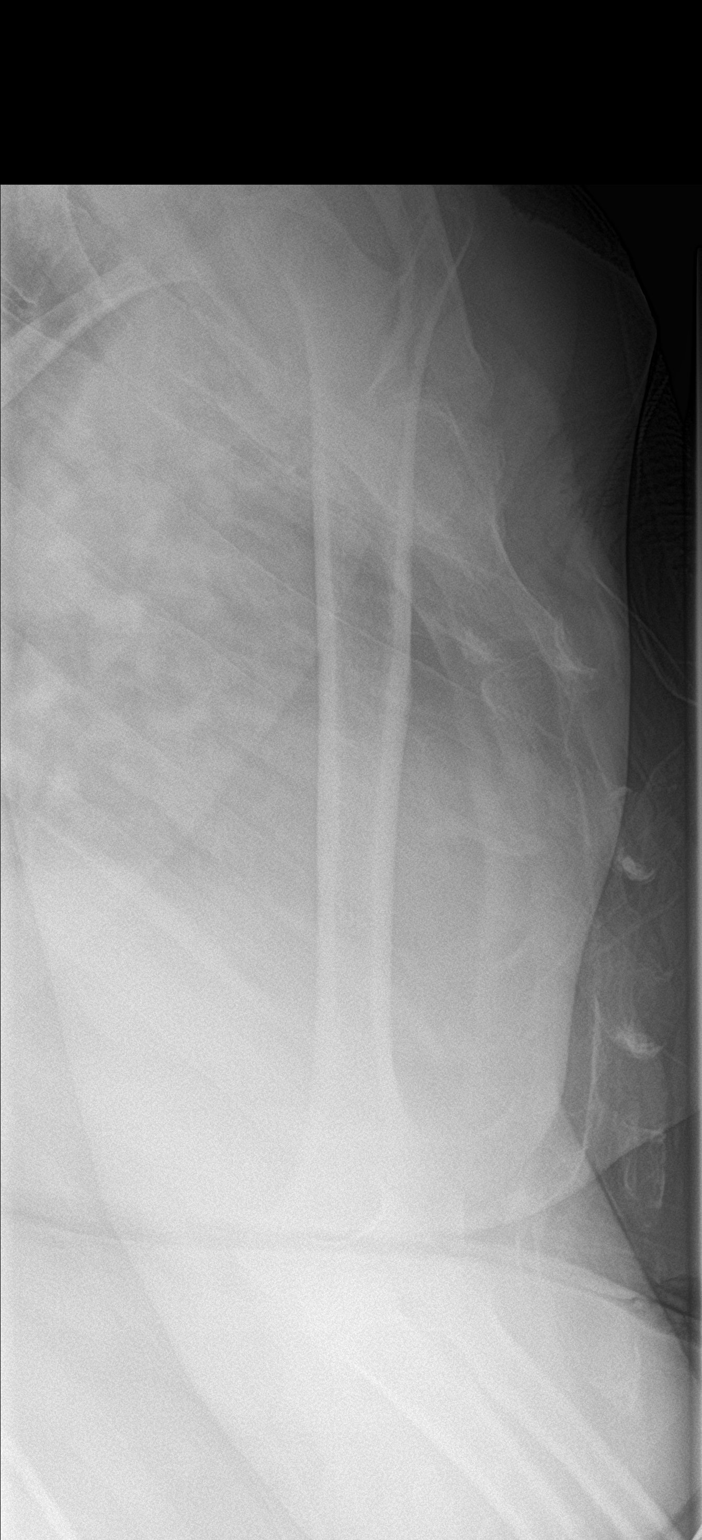

[humerus lat (2 of 2)]
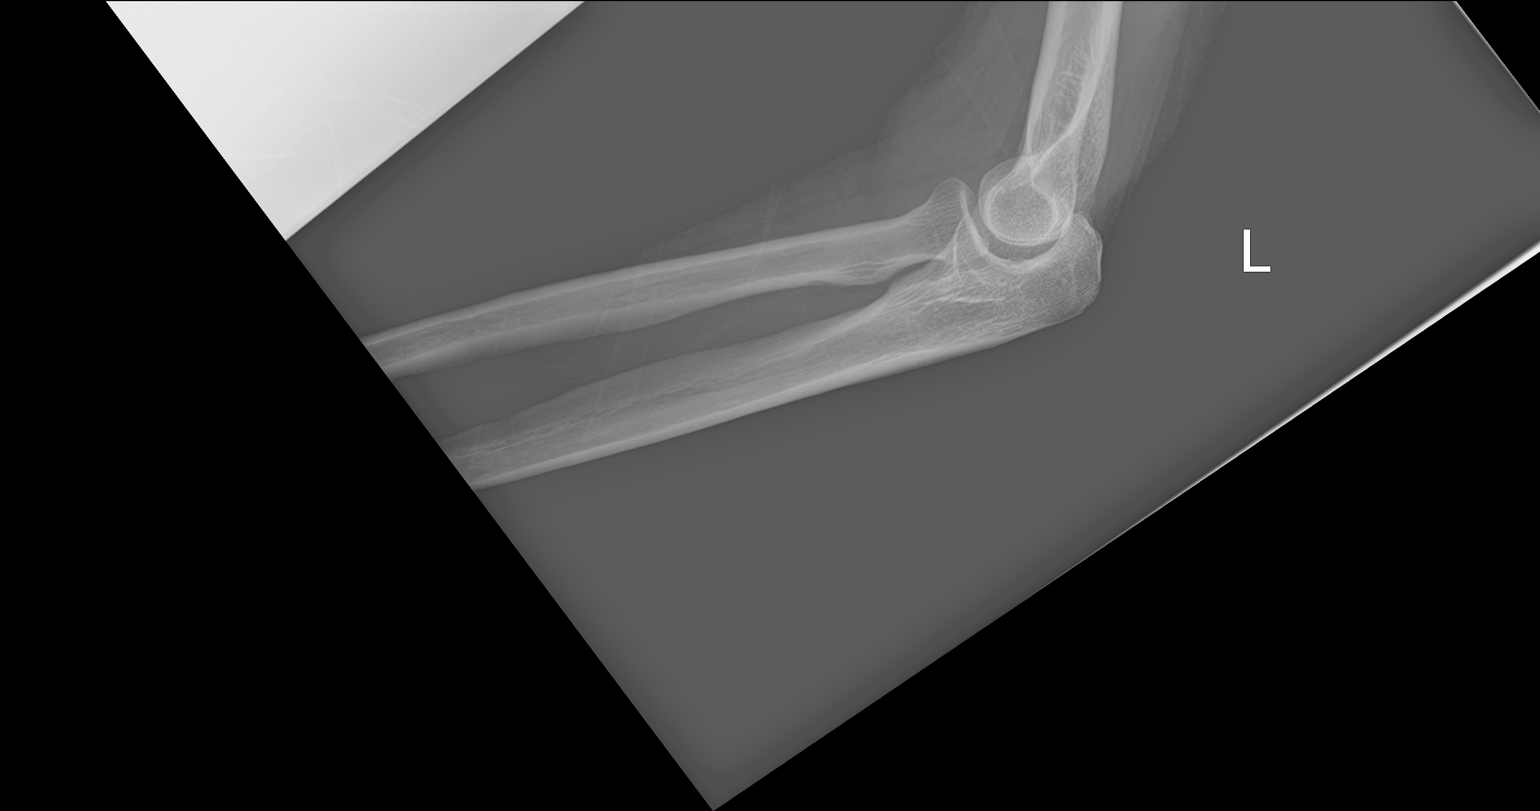

[2 of 2 positions shown; findings below may reference images not displayed]

FINDINGS: A comminuted closed fracture of the proximal humerus is noted
involving the surgical neck and greater tuberosity. No significant
displacement or angulation is identified. No joint dislocation is
seen.
IMPRESSION: Comminuted closed fracture of the proximal humerus involving the
surgical neck and greater tuberosity without significant
displacement greater than 1 cm or angulation greater than 45
degrees. Findings would be in keeping with a Neer category 1 part
fracture.

## 2019-10-09 DIAGNOSIS — R69 Illness, unspecified: Secondary | ICD-10-CM | POA: Diagnosis not present

## 2019-10-24 ENCOUNTER — Other Ambulatory Visit: Payer: Self-pay | Admitting: Oncology

## 2019-10-24 DIAGNOSIS — E538 Deficiency of other specified B group vitamins: Secondary | ICD-10-CM

## 2019-11-11 DIAGNOSIS — M62830 Muscle spasm of back: Secondary | ICD-10-CM | POA: Diagnosis not present

## 2019-11-11 DIAGNOSIS — M545 Low back pain, unspecified: Secondary | ICD-10-CM | POA: Diagnosis not present

## 2020-01-21 DIAGNOSIS — R69 Illness, unspecified: Secondary | ICD-10-CM | POA: Diagnosis not present

## 2020-04-21 DIAGNOSIS — Z124 Encounter for screening for malignant neoplasm of cervix: Secondary | ICD-10-CM | POA: Diagnosis not present

## 2020-04-21 DIAGNOSIS — Z1231 Encounter for screening mammogram for malignant neoplasm of breast: Secondary | ICD-10-CM | POA: Diagnosis not present

## 2020-04-21 DIAGNOSIS — Z01419 Encounter for gynecological examination (general) (routine) without abnormal findings: Secondary | ICD-10-CM | POA: Diagnosis not present

## 2020-04-21 DIAGNOSIS — E119 Type 2 diabetes mellitus without complications: Secondary | ICD-10-CM | POA: Diagnosis not present

## 2020-04-21 DIAGNOSIS — Z79899 Other long term (current) drug therapy: Secondary | ICD-10-CM | POA: Diagnosis not present

## 2020-04-21 DIAGNOSIS — Z0000 Encounter for general adult medical examination without abnormal findings: Secondary | ICD-10-CM | POA: Diagnosis not present

## 2020-04-26 DIAGNOSIS — R058 Other specified cough: Secondary | ICD-10-CM | POA: Diagnosis not present

## 2020-04-26 DIAGNOSIS — K1379 Other lesions of oral mucosa: Secondary | ICD-10-CM | POA: Diagnosis not present

## 2020-05-19 DIAGNOSIS — H524 Presbyopia: Secondary | ICD-10-CM | POA: Diagnosis not present

## 2020-05-26 ENCOUNTER — Encounter: Payer: Self-pay | Admitting: Hematology and Oncology

## 2020-05-26 ENCOUNTER — Other Ambulatory Visit: Payer: Self-pay | Admitting: Family Medicine

## 2020-05-26 DIAGNOSIS — Z1231 Encounter for screening mammogram for malignant neoplasm of breast: Secondary | ICD-10-CM

## 2020-06-01 ENCOUNTER — Ambulatory Visit
Admission: RE | Admit: 2020-06-01 | Discharge: 2020-06-01 | Disposition: A | Discharge status: Home / Self Care | Payer: 59 | Source: Ambulatory Visit | Attending: Family Medicine | Admitting: Family Medicine

## 2020-06-01 ENCOUNTER — Other Ambulatory Visit: Payer: Self-pay

## 2020-06-01 DIAGNOSIS — Z1231 Encounter for screening mammogram for malignant neoplasm of breast: Secondary | ICD-10-CM | POA: Insufficient documentation

## 2020-07-21 DIAGNOSIS — G8929 Other chronic pain: Secondary | ICD-10-CM | POA: Diagnosis not present

## 2020-07-21 DIAGNOSIS — M545 Low back pain, unspecified: Secondary | ICD-10-CM | POA: Diagnosis not present

## 2020-07-21 DIAGNOSIS — R69 Illness, unspecified: Secondary | ICD-10-CM | POA: Diagnosis not present

## 2020-07-28 ENCOUNTER — Other Ambulatory Visit: Payer: Self-pay

## 2020-07-28 DIAGNOSIS — D509 Iron deficiency anemia, unspecified: Secondary | ICD-10-CM

## 2020-07-30 ENCOUNTER — Other Ambulatory Visit: Payer: Self-pay

## 2020-07-30 ENCOUNTER — Inpatient Hospital Stay: Payer: 59

## 2020-07-30 ENCOUNTER — Inpatient Hospital Stay (HOSPITAL_BASED_OUTPATIENT_CLINIC_OR_DEPARTMENT_OTHER): Payer: 59 | Admitting: Hospice and Palliative Medicine

## 2020-07-30 ENCOUNTER — Inpatient Hospital Stay: Payer: 59 | Attending: Hospice and Palliative Medicine | Admitting: Oncology

## 2020-07-30 ENCOUNTER — Encounter: Payer: Self-pay | Admitting: Hospice and Palliative Medicine

## 2020-07-30 VITALS — BP 136/84 | HR 76 | Temp 96.6°F | Resp 18 | Wt 255.0 lb

## 2020-07-30 DIAGNOSIS — E538 Deficiency of other specified B group vitamins: Secondary | ICD-10-CM | POA: Diagnosis not present

## 2020-07-30 DIAGNOSIS — Z9884 Bariatric surgery status: Secondary | ICD-10-CM | POA: Diagnosis not present

## 2020-07-30 DIAGNOSIS — R519 Headache, unspecified: Secondary | ICD-10-CM | POA: Insufficient documentation

## 2020-07-30 DIAGNOSIS — D509 Iron deficiency anemia, unspecified: Secondary | ICD-10-CM

## 2020-07-30 DIAGNOSIS — R5383 Other fatigue: Secondary | ICD-10-CM | POA: Insufficient documentation

## 2020-07-30 LAB — CBC WITH DIFFERENTIAL/PLATELET
Abs Immature Granulocytes: 0.03 10*3/uL (ref 0.00–0.07)
Basophils Absolute: 0.1 10*3/uL (ref 0.0–0.1)
Basophils Relative: 1 %
Eosinophils Absolute: 0.4 10*3/uL (ref 0.0–0.5)
Eosinophils Relative: 5 %
HCT: 46.1 % — ABNORMAL HIGH (ref 36.0–46.0)
Hemoglobin: 14.7 g/dL (ref 12.0–15.0)
Immature Granulocytes: 0 %
Lymphocytes Relative: 21 %
Lymphs Abs: 1.4 10*3/uL (ref 0.7–4.0)
MCH: 25.8 pg — ABNORMAL LOW (ref 26.0–34.0)
MCHC: 31.9 g/dL (ref 30.0–36.0)
MCV: 81 fL (ref 80.0–100.0)
Monocytes Absolute: 0.5 10*3/uL (ref 0.1–1.0)
Monocytes Relative: 7 %
Neutro Abs: 4.4 10*3/uL (ref 1.7–7.7)
Neutrophils Relative %: 66 %
Platelets: 269 10*3/uL (ref 150–400)
RBC: 5.69 MIL/uL — ABNORMAL HIGH (ref 3.87–5.11)
RDW: 13.7 % (ref 11.5–15.5)
WBC: 6.8 10*3/uL (ref 4.0–10.5)
nRBC: 0 % (ref 0.0–0.2)

## 2020-07-30 LAB — COMPREHENSIVE METABOLIC PANEL
ALT: 22 U/L (ref 0–44)
AST: 23 U/L (ref 15–41)
Albumin: 3.7 g/dL (ref 3.5–5.0)
Alkaline Phosphatase: 92 U/L (ref 38–126)
Anion gap: 8 (ref 5–15)
BUN: 11 mg/dL (ref 6–20)
CO2: 25 mmol/L (ref 22–32)
Calcium: 8.7 mg/dL — ABNORMAL LOW (ref 8.9–10.3)
Chloride: 102 mmol/L (ref 98–111)
Creatinine, Ser: 0.66 mg/dL (ref 0.44–1.00)
GFR, Estimated: >60 mL/min (ref >60)
Glucose, Bld: 183 mg/dL — ABNORMAL HIGH (ref 70–99)
Potassium: 4.3 mmol/L (ref 3.5–5.1)
Sodium: 135 mmol/L (ref 135–145)
Total Bilirubin: 0.2 mg/dL — ABNORMAL LOW (ref 0.3–1.2)
Total Protein: 7.2 g/dL (ref 6.5–8.1)

## 2020-07-30 LAB — FERRITIN: Ferritin: 7 ng/mL — ABNORMAL LOW (ref 11–307)

## 2020-07-30 LAB — TSH: TSH: 2.743 u[IU]/mL (ref 0.350–4.500)

## 2020-07-30 LAB — IRON AND TIBC
Iron: 39 ug/dL (ref 28–170)
Saturation Ratios: 10 % — ABNORMAL LOW (ref 10.4–31.8)
TIBC: 410 ug/dL (ref 250–450)
UIBC: 371 ug/dL

## 2020-07-30 LAB — VITAMIN B12: Vitamin B-12: 244 pg/mL (ref 180–914)

## 2020-07-30 NOTE — Progress Notes (Signed)
Patient here for follow up of her IDA. Patient states that she has been fatigued and has been having some mild headaches. She also states that she gets dizzy sometimes when she bends over and stands back upright. She has recently been seen by her primary care MD and was told that her sodium was low and referred her back here for evaluation.

## 2020-07-30 NOTE — Progress Notes (Signed)
[facility]  [address], [address] [place], [place] [address] Phone: [phone]  Fax: [phone]   Clinic Day:  07/30/2020  Referring physician: Rayetta Humphrey, MD  Chief Complaint: Erin Howe is a 50 y.o. female s/p gastric bypass surgery with iron deficiency anemia, Vitamin D deficiency, and B-12 deficiency who is seen for new patient assessment.  HPI:  The patient underwent gastric bypass surgery in 2010. She has lost over 200 pounds. She was diagnosed with progressive microcytic iron deficiency anemia and B-12 deficiency in 2012.  She was initially seen by Dr. Bertis Ruddy on 11/24/2015. She was subsequently followed by Dr. Smith Robert who patient last saw in 2019. Dr. Merlene Pulling then saw patient in 2020.   She has received B-12 injections at home for several years - currently taking monthly. Oral iron was discontinued. Patient was previously receiving iron therapy with Feraheme (last 12/29/2016). Vitamin D deficiency was followed by her PCP but was ultimately discontinued.  The patient was last seen in the medical oncology clinic on 07/19/2018 by Dr. Merlene Pulling. At that time, she seemed to be doing well but had fatigue. Iron studies and Ferritin were normal. Patient was recommended to pursue sleep study. She was scheduled for follow up in 3 months for labs and then to return in 6 months for lab/MD visit but was lost to follow up.   During the interim, patient reports that she has been doing reasonably well up until a few months ago when she started having worsening fatigue.  Patient also endorses occasional headaches primarily when she bends over.  She has occasional dizziness with position changes.  She denies shortness of breath, chest pain, visual changes, fever or chills, night sweats, nausea, vomiting, diarrhea, constipation, blood or tarry stools.  No urinary symptoms.  No other symptomatic complaints.  Patient recently took Cologuard with results pending.  She  is not interested in colonoscopy.  She gets regular mammograms.  She is a non-smoker.  Patient takes meloxicam which relieves her headaches.  Patient reports that the fatigue is similar to how she felt when she previously required Feraheme.  She saw her PCP recently and was found to have a mild hyponatremia and was sent to [place] for evaluation.  Patient does endorse significant social stressors.  She works two jobs and sleeps on average 5 to 6 hours a night.  She has multiple children and grandchildren including some that are currently living with her.  She has 7 dogs/cats including some with medical problems.  She had to euthanize one of her dogs this morning.  Additionally, patient says that her house is currently being remodeled.   Past Medical History:  Diagnosis Date   Anemia     Past Surgical History:  Procedure Laterality Date   CHOLECYSTECTOMY     GASTRIC BYPASS  02-03-08   REFRACTIVE SURGERY  2006   WISDOM TOOTH EXTRACTION      Family History  Problem Relation Age of Onset   Atrial fibrillation Mother    Heart failure Mother    Deep vein thrombosis Mother    Diabetes Mother    Hyperlipidemia Mother    Hypertension Mother    Hyperlipidemia Father    Hypertension Father    Diabetes Sister    Stroke Maternal Grandfather    Heart disease Maternal Grandmother    Breast cancer Cousin        cousins both sides    Social History:  reports that she has never smoked. She  has quit using smokeless tobacco. She reports that she does not drink alcohol and does not use drugs. She does not smoke or drink alcohol. She denies any known exposures to radiations or toxins. She works full-time for [employer] (from home) and part-time for [employer]. She lives in [place]. The patient is alone today.  Allergies:  Allergies  Allergen Reactions   Other Other (See Comments)    Walnuts and Pecans;  History of Gastric Bypass pass   Amoxicillin    Penicillins Swelling   Tetanus  Toxoid    Tetanus Toxoids     Current Medications: Current Outpatient Medications  Medication Sig Dispense Refill   albuterol (PROVENTIL) (2.5 MG/3ML) 0.083% nebulizer solution Inhale 2.5 mg into the lungs every 4 (four) hours as needed.      amphetamine-dextroamphetamine (ADDERALL XR) 30 MG 24 hr capsule Take 30 mg by mouth daily.      amphetamine-dextroamphetamine (ADDERALL) 20 MG tablet 1 po q afternoon 90 tablet 0   clonazePAM (KLONOPIN) 0.5 MG tablet TAKE 1 TABLET BY MOUTH NIGHTLY AS NEEDED FOR ANXIETY  2   cyanocobalamin (,VITAMIN B-12,) 1000 MCG/ML injection INJECT 1 ML (1,000 MCG TOTAL) INTO THE SKIN EVERY 30 (THIRTY) DAYS. 3 mL 3   cyclobenzaprine (FLEXERIL) 5 MG tablet Take 1-2 tablets (5-10 mg total) by mouth 3 (three) times daily as needed for muscle spasms. 20 tablet 0   loratadine (CLARITIN) 10 MG tablet Take 1 tablet (10 mg total) by mouth daily. (Patient not taking: Reported on 07/19/2018) 30 tablet 11   meloxicam (MOBIC) 7.5 MG tablet Take 7.5 mg by mouth as needed.      omeprazole (PRILOSEC) 40 MG capsule Take 1 capsule (40 mg total) by mouth daily. 90 capsule 3   Vitamin D, Ergocalciferol, (DRISDOL) 50000 units CAPS capsule TAKE ONE CAPSULE BY MOUTH WEEKLY AS ONE DOSE (Patient not taking: Reported on 07/19/2018) 12 capsule 0   No current facility-administered medications for this visit.    Review of Systems  Constitutional:  Positive for malaise/fatigue. Negative for chills, diaphoresis, fever and weight loss.  HENT: Negative.  Negative for congestion, ear pain, hearing loss, sinus pain and sore throat.        Wears R. Hearing aid  Eyes: Negative.  Negative for blurred vision.  Respiratory:  Negative for cough (occasional), shortness of breath and wheezing.   Cardiovascular: Negative.  Negative for chest pain, palpitations, orthopnea, leg swelling and PND.  Gastrointestinal: Negative.  Negative for abdominal pain, blood in stool, constipation, diarrhea, melena, nausea and  vomiting.       No ice pica  Genitourinary: Negative.  Negative for dysuria, frequency, hematuria and urgency.  Musculoskeletal: Negative.  Negative for back pain, joint pain and myalgias.       Leg cramps  Skin: Negative.  Negative for rash.  Neurological:  Positive for headaches. Negative for dizziness, tingling, sensory change and weakness.  Endo/Heme/Allergies: Negative.  Does not bruise/bleed easily.  Psychiatric/Behavioral:  Negative for depression, memory loss and substance abuse. The patient is not nervous/anxious and does not have insomnia.   All other systems reviewed and are negative. Performance status (ECOG): 2  Vitals Last menstrual period 08/26/2017.   Physical Exam Vitals and nursing note reviewed.  Constitutional:      General: She is not in acute distress.    Appearance: She is well-developed. She is not diaphoretic.  HENT:     Head: Normocephalic and atraumatic.     Right Ear: Tympanic membrane  normal.     Left Ear: Tympanic membrane normal.     Mouth/Throat:     Mouth: Mucous membranes are moist.     Pharynx: Oropharynx is clear. No oropharyngeal exudate.  Eyes:     General: No scleral icterus.    Conjunctiva/sclera: Conjunctivae normal.     Pupils: Pupils are equal, round, and reactive to [place].  Cardiovascular:     Rate and Rhythm: Normal rate and regular rhythm.     Heart sounds: Normal heart sounds. No murmur heard. Pulmonary:     Effort: Pulmonary effort is normal. No respiratory distress.     Breath sounds: Normal breath sounds. No wheezing.  Chest:  Breasts:    Right: No supraclavicular adenopathy.     Left: No supraclavicular adenopathy.  Abdominal:     General: Bowel sounds are normal. There is no distension.     Palpations: Abdomen is soft.     Tenderness: There is no abdominal tenderness.  Musculoskeletal:        General: Normal range of motion.     Cervical back: Normal range of motion and neck supple.  Lymphadenopathy:     Cervical: No  cervical adenopathy.     Upper Body:     Right upper body: No supraclavicular adenopathy.     Left upper body: No supraclavicular adenopathy.  Skin:    General: Skin is warm and dry.  Neurological:     Mental Status: She is alert and oriented to person, place, and time.  Psychiatric:        Behavior: Behavior normal.        Thought Content: Thought content normal.        Judgment: Judgment normal.    No visits with results within 3 Day(s) from this visit.  Latest known visit with results is:  Appointment on 07/17/2018  Component Date Value Ref Range Status   Folate 07/17/2018 8.8  >5.9 ng/mL Final   Performed at [facility], [address] [place], [place] [address]   Sed Rate 07/17/2018 4  0 - 30 mm/hr Final   Performed at [facility], [address] [place], [place] [address]   Iron 07/17/2018 70  28 - 170 ug/dL Final   TIBC 95/28/4132 305  250 - 450 ug/dL Final   Saturation Ratios 07/17/2018 23  10.4 - 31.8 % Final   UIBC 07/17/2018 235  ug/dL Final   Performed at [facility], [address] [place], [place] [address]   Ferritin 07/17/2018 65  11 - 307 ng/mL Final   Performed at [facility], [address] [place], [place] [address]   WBC 07/17/2018 8.2  4.0 - 10.5 K/uL Final   RBC 07/17/2018 5.36 (A) 3.87 - 5.11 MIL/uL Final   Hemoglobin 07/17/2018 15.7 (A) 12.0 - 15.0 g/dL Final   HCT 66/44/0347 46.2 (A) 36.0 - 46.0 % Final   MCV 07/17/2018 86.2  80.0 - 100.0 fL Final   MCH 07/17/2018 29.3  26.0 - 34.0 pg Final   MCHC 07/17/2018 34.0  30.0 - 36.0 g/dL Final   RDW 42/59/5638 13.2  11.5 - 15.5 % Final   Platelets 07/17/2018 229  150 - 400 K/uL Final   nRBC 07/17/2018 0.0  0.0 - 0.2 % Final   Neutrophils Relative % 07/17/2018 69  % Final   Neutro Abs 07/17/2018 5.7  1.7 - 7.7 K/uL Final   Lymphocytes Relative 07/17/2018 19  % Final   Lymphs Abs  07/17/2018 1.6  0.7 - 4.0 K/uL Final   Monocytes Relative 07/17/2018 7  %  Final   Monocytes Absolute 07/17/2018 0.5  0.1 - 1.0 K/uL Final   Eosinophils Relative 07/17/2018 3  % Final   Eosinophils Absolute 07/17/2018 0.3  0.0 - 0.5 K/uL Final   Basophils Relative 07/17/2018 1  % Final   Basophils Absolute 07/17/2018 0.0  0.0 - 0.1 K/uL Final   Immature Granulocytes 07/17/2018 1  % Final   Abs Immature Granulocytes 07/17/2018 0.07  0.00 - 0.07 K/uL Final   Performed at [facility], [address] [place], [place] [address]    Assessment:  Auriella Wieand is a 50 y.o. female s/p gastric bypass surgery (2011) with iron deficiency anemia and B12 deficiency.  She developed microcytic anemia in 2012.  She has received Feraheme x2 (12/03/2015-12/09/2015), x2 (05/16/2016-05/24/2016), x1 on 12/07/2016, and x1 12/29/2016.   She receives B12 injections at home.   Labs followed: 09/17/2017: Hemoglobin 15.0, hematocrit 44.8, iron saturation 22%, ferritin 84. 06/15/2017: B12 545 12/12/2017: Hemoglobin 15.7, hematocrit 46.8, iron saturation 20%, ferritin 73. 07/17/2018: Hemoglobin 15.7, hematocrit 46.2, iron saturation 23%, ferritin 65. Folate 8.8. Sed rate 4.  07/30/2020: Hemoglobin 14.7, hematocrit 46.1   Symptomatically, she is fatigued with occasional positional headaches.  She denies any bleeding.  Exam is unremarkable.  Plan: 1.   Iron deficiency anemia s/p gastric bypass  Hg is currently WNL Ferritin pending  Add Iron studies  Discuss IV iron if ferritin < 30 2.   B12 deficiency  She receives B12 at home.  Check B12 and RBC folate 3.   Fatigue  Etiology unclear.  Check TSH 4.    Headache  Etiology unclear  Patient taking NSAID with good relief  Recommend follow up with PCP  Discussed with Dr. Smith Robert. RTC in 3 months for repeat labs (CBC, Iron studies, and Ferritin) and 6 months for MD/labs (CBC with diff, iron studies, ferritin)  I discussed the assessment and treatment plan with the patient.  The patient was provided an opportunity to ask  questions and all were answered.  The patient agreed with the plan and demonstrated an understanding of the instructions.  The patient was advised to call back if the symptoms worsen or if the condition fails to improve as anticipated.    Laurette Schimke, PhD, NP-C    07/30/2020, 1:16 PM

## 2020-08-01 LAB — FOLATE RBC
Folate, Hemolysate: 318 ng/mL
Folate, RBC: 681 ng/mL (ref >498)
Hematocrit: 46.7 % — ABNORMAL HIGH (ref 34.0–46.6)

## 2020-08-02 ENCOUNTER — Telehealth (HOSPITAL_COMMUNITY): Payer: Self-pay | Admitting: Nurse Practitioner

## 2020-08-02 DIAGNOSIS — D508 Other iron deficiency anemias: Secondary | ICD-10-CM

## 2020-08-02 DIAGNOSIS — K9589 Other complications of other bariatric procedure: Secondary | ICD-10-CM

## 2020-08-02 NOTE — Telephone Encounter (Signed)
Reviewed labs. Ferritin low. Recommend IV iron. Awaiting confirmation of brand based on insurance. Previously, venofer preferred. Plan for venofer x 5. Follow up as planned.   No answer when I called patient to discuss. Left vm.

## 2020-08-11 ENCOUNTER — Other Ambulatory Visit: Payer: Self-pay

## 2020-08-11 ENCOUNTER — Inpatient Hospital Stay: Payer: 59 | Attending: Oncology

## 2020-08-11 VITALS — BP 138/91 | HR 72 | Temp 96.0°F | Resp 18

## 2020-08-11 DIAGNOSIS — E559 Vitamin D deficiency, unspecified: Secondary | ICD-10-CM | POA: Insufficient documentation

## 2020-08-11 DIAGNOSIS — Z9884 Bariatric surgery status: Secondary | ICD-10-CM | POA: Diagnosis not present

## 2020-08-11 DIAGNOSIS — D508 Other iron deficiency anemias: Secondary | ICD-10-CM | POA: Diagnosis not present

## 2020-08-11 DIAGNOSIS — E538 Deficiency of other specified B group vitamins: Secondary | ICD-10-CM | POA: Diagnosis not present

## 2020-08-11 DIAGNOSIS — K9589 Other complications of other bariatric procedure: Secondary | ICD-10-CM

## 2020-08-11 MED ORDER — SODIUM CHLORIDE 0.9 % IV SOLN: 200.0000 mg | Freq: Once | INTRAVENOUS | Status: DC

## 2020-08-11 MED ORDER — IRON SUCROSE 20 MG/ML IV SOLN
200.0000 mg | Freq: Once | INTRAVENOUS | Status: AC
Start: 2020-08-11 — End: 2020-08-11
  Administered 2020-08-11: 200 mg via INTRAVENOUS

## 2020-08-11 MED ORDER — SODIUM CHLORIDE 0.9 % IV SOLN
Freq: Once | INTRAVENOUS | Status: AC
Start: 2020-08-11 — End: 2020-08-11
  Filled 2020-08-11: qty 250

## 2020-08-12 ENCOUNTER — Other Ambulatory Visit: Payer: Self-pay | Admitting: Oncology

## 2020-08-12 DIAGNOSIS — E538 Deficiency of other specified B group vitamins: Secondary | ICD-10-CM

## 2020-08-13 ENCOUNTER — Other Ambulatory Visit: Payer: Self-pay

## 2020-08-13 ENCOUNTER — Inpatient Hospital Stay: Payer: 59

## 2020-08-13 VITALS — BP 126/79 | HR 83 | Temp 97.0°F | Resp 18

## 2020-08-13 DIAGNOSIS — Z9884 Bariatric surgery status: Secondary | ICD-10-CM | POA: Diagnosis not present

## 2020-08-13 DIAGNOSIS — K9589 Other complications of other bariatric procedure: Secondary | ICD-10-CM

## 2020-08-13 DIAGNOSIS — E538 Deficiency of other specified B group vitamins: Secondary | ICD-10-CM | POA: Diagnosis not present

## 2020-08-13 DIAGNOSIS — E559 Vitamin D deficiency, unspecified: Secondary | ICD-10-CM | POA: Diagnosis not present

## 2020-08-13 DIAGNOSIS — D508 Other iron deficiency anemias: Secondary | ICD-10-CM

## 2020-08-13 MED ORDER — IRON SUCROSE 20 MG/ML IV SOLN
200.0000 mg | Freq: Once | INTRAVENOUS | Status: AC
  Administered 2020-08-13: 200 mg via INTRAVENOUS
  Filled 2020-08-13: qty 10

## 2020-08-13 MED ORDER — SODIUM CHLORIDE 0.9 % IV SOLN
Freq: Once | INTRAVENOUS | Status: AC
  Filled 2020-08-13: qty 250

## 2020-08-13 MED ORDER — SODIUM CHLORIDE 0.9 % IV SOLN: 200.0000 mg | Freq: Once | INTRAVENOUS | Status: DC

## 2020-08-18 ENCOUNTER — Inpatient Hospital Stay: Payer: 59

## 2020-08-18 ENCOUNTER — Other Ambulatory Visit: Payer: Self-pay

## 2020-08-18 ENCOUNTER — Encounter: Payer: Self-pay | Admitting: Nurse Practitioner

## 2020-08-18 VITALS — BP 107/78 | HR 78 | Temp 95.9°F | Resp 18

## 2020-08-18 DIAGNOSIS — D508 Other iron deficiency anemias: Secondary | ICD-10-CM

## 2020-08-18 DIAGNOSIS — K9589 Other complications of other bariatric procedure: Secondary | ICD-10-CM

## 2020-08-18 DIAGNOSIS — E559 Vitamin D deficiency, unspecified: Secondary | ICD-10-CM | POA: Diagnosis not present

## 2020-08-18 DIAGNOSIS — E538 Deficiency of other specified B group vitamins: Secondary | ICD-10-CM | POA: Diagnosis not present

## 2020-08-18 DIAGNOSIS — Z9884 Bariatric surgery status: Secondary | ICD-10-CM | POA: Diagnosis not present

## 2020-08-18 MED ORDER — SODIUM CHLORIDE 0.9 % IV SOLN: 200.0000 mg | Freq: Once | INTRAVENOUS | Status: DC

## 2020-08-18 MED ORDER — IRON SUCROSE 20 MG/ML IV SOLN
200.0000 mg | Freq: Once | INTRAVENOUS | Status: AC
  Administered 2020-08-18: 200 mg via INTRAVENOUS
  Filled 2020-08-18: qty 10

## 2020-08-18 MED ORDER — SODIUM CHLORIDE 0.9 % IV SOLN
Freq: Once | INTRAVENOUS | Status: AC
  Filled 2020-08-18: qty 250

## 2020-08-20 ENCOUNTER — Inpatient Hospital Stay: Payer: 59

## 2020-08-20 ENCOUNTER — Other Ambulatory Visit: Payer: Self-pay

## 2020-08-20 VITALS — BP 120/84 | HR 82 | Temp 97.0°F | Resp 18

## 2020-08-20 DIAGNOSIS — E538 Deficiency of other specified B group vitamins: Secondary | ICD-10-CM | POA: Diagnosis not present

## 2020-08-20 DIAGNOSIS — E559 Vitamin D deficiency, unspecified: Secondary | ICD-10-CM | POA: Diagnosis not present

## 2020-08-20 DIAGNOSIS — D508 Other iron deficiency anemias: Secondary | ICD-10-CM

## 2020-08-20 DIAGNOSIS — Z9884 Bariatric surgery status: Secondary | ICD-10-CM | POA: Diagnosis not present

## 2020-08-20 DIAGNOSIS — K9589 Other complications of other bariatric procedure: Secondary | ICD-10-CM

## 2020-08-20 MED ORDER — SODIUM CHLORIDE 0.9 % IV SOLN: 200.0000 mg | Freq: Once | INTRAVENOUS | Status: DC

## 2020-08-20 MED ORDER — IRON SUCROSE 20 MG/ML IV SOLN
200.0000 mg | Freq: Once | INTRAVENOUS | Status: AC
  Administered 2020-08-20: 200 mg via INTRAVENOUS
  Filled 2020-08-20: qty 10

## 2020-08-20 MED ORDER — SODIUM CHLORIDE 0.9 % IV SOLN
Freq: Once | INTRAVENOUS | Status: AC
  Filled 2020-08-20: qty 250

## 2020-08-24 ENCOUNTER — Inpatient Hospital Stay: Payer: 59

## 2020-08-24 ENCOUNTER — Other Ambulatory Visit: Payer: Self-pay

## 2020-08-24 VITALS — BP 128/81 | HR 79 | Temp 97.0°F | Resp 18

## 2020-08-24 DIAGNOSIS — D508 Other iron deficiency anemias: Secondary | ICD-10-CM | POA: Diagnosis not present

## 2020-08-24 DIAGNOSIS — K9589 Other complications of other bariatric procedure: Secondary | ICD-10-CM

## 2020-08-24 DIAGNOSIS — E538 Deficiency of other specified B group vitamins: Secondary | ICD-10-CM | POA: Diagnosis not present

## 2020-08-24 DIAGNOSIS — E559 Vitamin D deficiency, unspecified: Secondary | ICD-10-CM | POA: Diagnosis not present

## 2020-08-24 DIAGNOSIS — Z9884 Bariatric surgery status: Secondary | ICD-10-CM | POA: Diagnosis not present

## 2020-08-24 MED ORDER — SODIUM CHLORIDE 0.9 % IV SOLN
Freq: Once | INTRAVENOUS | Status: AC
  Filled 2020-08-24: qty 250

## 2020-08-24 MED ORDER — IRON SUCROSE 20 MG/ML IV SOLN
200.0000 mg | Freq: Once | INTRAVENOUS | Status: AC
  Administered 2020-08-24: 200 mg via INTRAVENOUS
  Filled 2020-08-24: qty 10

## 2020-08-24 MED ORDER — SODIUM CHLORIDE 0.9 % IV SOLN: 200.0000 mg | Freq: Once | INTRAVENOUS | Status: DC

## 2020-09-06 DIAGNOSIS — U071 COVID-19: Secondary | ICD-10-CM | POA: Diagnosis not present

## 2020-10-04 ENCOUNTER — Ambulatory Visit
Admission: RE | Admit: 2020-10-04 | Discharge: 2020-10-04 | Disposition: A | Discharge status: Home / Self Care | Payer: 59 | Source: Ambulatory Visit | Attending: Family Medicine | Admitting: Family Medicine

## 2020-10-04 ENCOUNTER — Other Ambulatory Visit: Payer: Self-pay | Admitting: Family Medicine

## 2020-10-04 ENCOUNTER — Other Ambulatory Visit: Payer: Self-pay

## 2020-10-04 ENCOUNTER — Ambulatory Visit
Admission: RE | Admit: 2020-10-04 | Discharge: 2020-10-04 | Disposition: A | Discharge status: Home / Self Care | Payer: 59 | Attending: Family Medicine | Admitting: Family Medicine

## 2020-10-04 DIAGNOSIS — R053 Chronic cough: Secondary | ICD-10-CM | POA: Insufficient documentation

## 2020-10-04 DIAGNOSIS — U099 Post covid-19 condition, unspecified: Secondary | ICD-10-CM | POA: Diagnosis not present

## 2020-10-21 DIAGNOSIS — R69 Illness, unspecified: Secondary | ICD-10-CM | POA: Diagnosis not present

## 2020-10-28 ENCOUNTER — Other Ambulatory Visit: Payer: Self-pay | Admitting: *Deleted

## 2020-10-28 ENCOUNTER — Inpatient Hospital Stay: Payer: 59

## 2020-10-28 ENCOUNTER — Telehealth: Payer: Self-pay | Admitting: Oncology

## 2020-10-28 DIAGNOSIS — K9589 Other complications of other bariatric procedure: Secondary | ICD-10-CM

## 2020-10-28 NOTE — Telephone Encounter (Signed)
Pt missed her appt. Today . Needs to reschedule . Please call at [phone]

## 2020-10-29 ENCOUNTER — Other Ambulatory Visit: Payer: 59

## 2020-11-02 ENCOUNTER — Other Ambulatory Visit: Payer: 59

## 2020-11-04 ENCOUNTER — Other Ambulatory Visit: Payer: Self-pay

## 2020-11-04 ENCOUNTER — Inpatient Hospital Stay: Payer: 59 | Attending: Oncology

## 2020-11-04 DIAGNOSIS — R053 Chronic cough: Secondary | ICD-10-CM | POA: Diagnosis not present

## 2020-11-04 DIAGNOSIS — D509 Iron deficiency anemia, unspecified: Secondary | ICD-10-CM | POA: Insufficient documentation

## 2020-11-04 DIAGNOSIS — U099 Post covid-19 condition, unspecified: Secondary | ICD-10-CM | POA: Diagnosis not present

## 2020-11-04 DIAGNOSIS — E538 Deficiency of other specified B group vitamins: Secondary | ICD-10-CM | POA: Diagnosis not present

## 2020-11-04 DIAGNOSIS — D508 Other iron deficiency anemias: Secondary | ICD-10-CM

## 2020-11-04 DIAGNOSIS — K9589 Other complications of other bariatric procedure: Secondary | ICD-10-CM

## 2020-11-04 LAB — CBC
HCT: 44 % (ref 36.0–46.0)
Hemoglobin: 14.6 g/dL (ref 12.0–15.0)
MCH: 27.8 pg (ref 26.0–34.0)
MCHC: 33.2 g/dL (ref 30.0–36.0)
MCV: 83.8 fL (ref 80.0–100.0)
Platelets: 206 10*3/uL (ref 150–400)
RBC: 5.25 MIL/uL — ABNORMAL HIGH (ref 3.87–5.11)
RDW: 14.9 % (ref 11.5–15.5)
WBC: 7.1 10*3/uL (ref 4.0–10.5)
nRBC: 0 % (ref 0.0–0.2)

## 2020-11-04 LAB — IRON AND TIBC
Iron: 70 ug/dL (ref 28–170)
Saturation Ratios: 24 % (ref 10.4–31.8)
TIBC: 294 ug/dL (ref 250–450)
UIBC: 224 ug/dL

## 2020-11-04 LAB — FERRITIN: Ferritin: 59 ng/mL (ref 11–307)

## 2020-12-06 DIAGNOSIS — U099 Post covid-19 condition, unspecified: Secondary | ICD-10-CM | POA: Diagnosis not present

## 2020-12-06 DIAGNOSIS — R0609 Other forms of dyspnea: Secondary | ICD-10-CM | POA: Diagnosis not present

## 2020-12-23 DIAGNOSIS — R052 Subacute cough: Secondary | ICD-10-CM | POA: Diagnosis not present

## 2020-12-23 DIAGNOSIS — R053 Chronic cough: Secondary | ICD-10-CM | POA: Diagnosis not present

## 2020-12-23 DIAGNOSIS — K146 Glossodynia: Secondary | ICD-10-CM | POA: Diagnosis not present

## 2020-12-23 DIAGNOSIS — E119 Type 2 diabetes mellitus without complications: Secondary | ICD-10-CM | POA: Diagnosis not present

## 2021-01-21 DIAGNOSIS — R69 Illness, unspecified: Secondary | ICD-10-CM | POA: Diagnosis not present

## 2021-01-26 ENCOUNTER — Other Ambulatory Visit: Payer: Self-pay | Admitting: *Deleted

## 2021-01-26 DIAGNOSIS — K9589 Other complications of other bariatric procedure: Secondary | ICD-10-CM

## 2021-01-28 DIAGNOSIS — R059 Cough, unspecified: Secondary | ICD-10-CM | POA: Diagnosis not present

## 2021-02-02 ENCOUNTER — Inpatient Hospital Stay: Payer: 59

## 2021-02-02 ENCOUNTER — Inpatient Hospital Stay: Payer: 59 | Admitting: Oncology

## 2021-02-02 ENCOUNTER — Ambulatory Visit: Payer: 59 | Admitting: Oncology

## 2021-02-02 ENCOUNTER — Other Ambulatory Visit: Payer: 59

## 2021-02-08 ENCOUNTER — Inpatient Hospital Stay: Payer: 59

## 2021-02-08 ENCOUNTER — Inpatient Hospital Stay: Payer: 59 | Admitting: Oncology

## 2021-02-14 ENCOUNTER — Other Ambulatory Visit: Payer: Self-pay | Admitting: *Deleted

## 2021-02-14 MED ORDER — "BD INTEGRA SYRINGE 25G X 1"" 3 ML MISC": 3 refills | Status: AC

## 2021-02-28 ENCOUNTER — Encounter: Payer: Self-pay | Admitting: Oncology

## 2021-03-01 ENCOUNTER — Inpatient Hospital Stay: Payer: 59

## 2021-03-01 ENCOUNTER — Inpatient Hospital Stay: Payer: 59 | Admitting: Oncology

## 2021-03-06 DIAGNOSIS — G4733 Obstructive sleep apnea (adult) (pediatric): Secondary | ICD-10-CM | POA: Diagnosis not present

## 2021-03-10 ENCOUNTER — Other Ambulatory Visit: Payer: Self-pay

## 2021-03-10 ENCOUNTER — Inpatient Hospital Stay: Payer: 59 | Attending: Oncology | Admitting: Oncology

## 2021-03-10 ENCOUNTER — Encounter: Payer: Self-pay | Admitting: Oncology

## 2021-03-10 ENCOUNTER — Inpatient Hospital Stay: Payer: 59

## 2021-03-10 VITALS — BP 137/86 | HR 73 | Temp 96.7°F | Resp 18 | Wt 263.0 lb

## 2021-03-10 DIAGNOSIS — D803 Selective deficiency of immunoglobulin G [IgG] subclasses: Secondary | ICD-10-CM | POA: Insufficient documentation

## 2021-03-10 DIAGNOSIS — E538 Deficiency of other specified B group vitamins: Secondary | ICD-10-CM | POA: Insufficient documentation

## 2021-03-10 DIAGNOSIS — D508 Other iron deficiency anemias: Secondary | ICD-10-CM | POA: Diagnosis not present

## 2021-03-10 DIAGNOSIS — D509 Iron deficiency anemia, unspecified: Secondary | ICD-10-CM | POA: Diagnosis not present

## 2021-03-10 DIAGNOSIS — K9589 Other complications of other bariatric procedure: Secondary | ICD-10-CM | POA: Diagnosis not present

## 2021-03-10 DIAGNOSIS — E559 Vitamin D deficiency, unspecified: Secondary | ICD-10-CM | POA: Insufficient documentation

## 2021-03-10 DIAGNOSIS — Z9884 Bariatric surgery status: Secondary | ICD-10-CM | POA: Diagnosis not present

## 2021-03-10 LAB — CBC WITH DIFFERENTIAL/PLATELET
Abs Immature Granulocytes: 0.04 10*3/uL (ref 0.00–0.07)
Basophils Absolute: 0.1 10*3/uL (ref 0.0–0.1)
Basophils Relative: 1 %
Eosinophils Absolute: 0.5 10*3/uL (ref 0.0–0.5)
Eosinophils Relative: 6 %
HCT: 46.3 % — ABNORMAL HIGH (ref 36.0–46.0)
Hemoglobin: 15.6 g/dL — ABNORMAL HIGH (ref 12.0–15.0)
Immature Granulocytes: 1 %
Lymphocytes Relative: 28 %
Lymphs Abs: 2 10*3/uL (ref 0.7–4.0)
MCH: 28.8 pg (ref 26.0–34.0)
MCHC: 33.7 g/dL (ref 30.0–36.0)
MCV: 85.4 fL (ref 80.0–100.0)
Monocytes Absolute: 0.5 10*3/uL (ref 0.1–1.0)
Monocytes Relative: 7 %
Neutro Abs: 4.1 10*3/uL (ref 1.7–7.7)
Neutrophils Relative %: 57 %
Platelets: 250 10*3/uL (ref 150–400)
RBC: 5.42 MIL/uL — ABNORMAL HIGH (ref 3.87–5.11)
RDW: 13.1 % (ref 11.5–15.5)
WBC: 7.2 10*3/uL (ref 4.0–10.5)
nRBC: 0 % (ref 0.0–0.2)

## 2021-03-10 LAB — IRON AND TIBC
Iron: 74 ug/dL (ref 28–170)
Saturation Ratios: 23 % (ref 10.4–31.8)
TIBC: 321 ug/dL (ref 250–450)
UIBC: 247 ug/dL

## 2021-03-10 LAB — FERRITIN: Ferritin: 17 ng/mL (ref 11–307)

## 2021-03-10 NOTE — Progress Notes (Signed)
?[facility]  ?[address] ?[place], [place] [address] ?Phone: [phone] ? Fax: [phone] ? ? ?Clinic Day:  03/10/2021 ? ?Referring physician: Sharyne Peach, MD ? ?Chief Complaint: Erin Howe is a 51 y.o. female s/p gastric bypass surgery with iron deficiency anemia, Vitamin D deficiency, and B-12 deficiency who is seen for new patient assessment. ? ?HPI:  The patient underwent gastric bypass surgery in 2010. She has lost over 200 pounds. She was diagnosed with progressive microcytic iron deficiency anemia and B-12 deficiency in 2012.  She was initially seen by Dr. Alvy Bimler on 11/24/2015. She was subsequently followed by Dr. Janese Banks who patient last saw in 2019. Dr. Mike Gip then saw patient in 2020.  ? ?She has received B-12 injections at home for several years - currently taking monthly. Oral iron was discontinued. Patient was previously receiving iron therapy with Feraheme (last 12/29/2016). Vitamin D deficiency was followed by her PCP but was ultimately discontinued. ? ?Interval History- ?Erin Howe is a 51 year old female who presents back today for follow-up for iron deficiency anemia.  She last received IV Venofer from 08/11/2020 - 08/24/2020.  Ferritin from 11/04/2020 was 59.  Hemoglobin was 14.6.  States she is feeling tired but works 2 jobs and takes care of of 5 children and grandson.  Overall feels stable.  No new concerns. ? ?Past Medical History:  ?Diagnosis Date  ? Anemia   ? ? ?Past Surgical History:  ?Procedure Laterality Date  ? CHOLECYSTECTOMY    ? GASTRIC BYPASS  02-03-08  ? REFRACTIVE SURGERY  2006  ? WISDOM TOOTH EXTRACTION    ? ? ?Family History  ?Problem Relation Age of Onset  ? Atrial fibrillation Mother   ? Heart failure Mother   ? Deep vein thrombosis Mother   ? Diabetes Mother   ? Hyperlipidemia Mother   ? Hypertension Mother   ? Hyperlipidemia Father   ? Hypertension Father   ? Diabetes Sister   ? Stroke Maternal Grandfather   ? Heart disease  Maternal Grandmother   ? Breast cancer Cousin   ?     cousins both sides  ? ? ?Social History:  reports that she has never smoked. She has quit using smokeless tobacco. She reports that she does not drink alcohol and does not use drugs. She does not smoke or drink alcohol. She denies any known exposures to radiations or toxins. She works full-time for [employer] (from home) and part-time for [employer] and [employer]. She lives in [place]. The patient is alone today. ? ?Allergies:  ?Allergies  ?Allergen Reactions  ? Other Other (See Comments)  ?  Walnuts and Pecans;  History of Gastric Bypass pass  ? Amoxicillin   ? Penicillins Swelling  ? Tetanus Toxoid   ? Tetanus Toxoids   ? ? ?Current Medications: ?Current Outpatient Medications  ?Medication Sig Dispense Refill  ? albuterol (PROVENTIL) (2.5 MG/3ML) 0.083% nebulizer solution Inhale 2.5 mg into the lungs every 4 (four) hours as needed.     ? amphetamine-dextroamphetamine (ADDERALL) 30 MG tablet Take 30 mg by mouth 2 (two) times daily.    ? clonazePAM (KLONOPIN) 0.5 MG tablet TAKE 1 TABLET BY MOUTH NIGHTLY AS NEEDED FOR ANXIETY  2  ? cyanocobalamin (,VITAMIN B-12,) 1000 MCG/ML injection INJECT 1 ML (1,000 MCG TOTAL) INTO THE SKIN EVERY 30 (THIRTY) DAYS. 3 mL 3  ? cyclobenzaprine (FLEXERIL) 5 MG tablet Take 1-2 tablets (5-10 mg total) by mouth 3 (three) times daily as needed for  muscle spasms. 20 tablet 0  ? ipratropium (ATROVENT) 0.02 % nebulizer solution SMARTSIG:2.5 Milliliter(s) Via Nebulizer 4 Times Daily    ? loratadine (CLARITIN) 10 MG tablet Take 1 tablet (10 mg total) by mouth daily. 30 tablet 11  ? meloxicam (MOBIC) 7.5 MG tablet Take 7.5 mg by mouth as needed.     ? omeprazole (PRILOSEC) 40 MG capsule Take 1 capsule (40 mg total) by mouth daily. 90 capsule 3  ? SYRINGE-NEEDLE, DISP, 3 ML (BD INTEGRA SYRINGE) 25G X 1" 3 ML MISC Use for B 12 injections every 30 days 3 each 3  ? Vitamin D, Ergocalciferol, (DRISDOL) 50000 units CAPS capsule TAKE ONE CAPSULE  BY MOUTH WEEKLY AS ONE DOSE 12 capsule 0  ? ?No current facility-administered medications for this visit.  ? ? ?Review of Systems  ?Constitutional:  Positive for malaise/fatigue. Negative for chills, fever and weight loss.  ?HENT:  Negative for congestion, ear pain and tinnitus.   ?Eyes: Negative.  Negative for blurred vision and double vision.  ?Respiratory: Negative.  Negative for cough, sputum production and shortness of breath.   ?Cardiovascular: Negative.  Negative for chest pain, palpitations and leg swelling.  ?Gastrointestinal: Negative.  Negative for abdominal pain, constipation, diarrhea, nausea and vomiting.  ?Genitourinary:  Negative for dysuria, frequency and urgency.  ?Musculoskeletal:  Negative for back pain and falls.  ?Skin: Negative.  Negative for rash.  ?Neurological: Negative.  Negative for weakness and headaches.  ?Endo/Heme/Allergies: Negative.  Does not bruise/bleed easily.  ?Psychiatric/Behavioral:  Negative for depression. The patient has insomnia. The patient is not nervous/anxious.   ?Performance status (ECOG): 2 ? ?Vitals ?Last menstrual period 08/26/2017.  ? ?Physical Exam ?Constitutional:   ?   Appearance: She is obese.  ? ? ?Appointment on 03/10/2021  ?Component Date Value Ref Range Status  ? WBC 03/10/2021 7.2  4.0 - 10.5 K/uL Final  ? RBC 03/10/2021 5.42 (H)  3.87 - 5.11 MIL/uL Final  ? Hemoglobin 03/10/2021 15.6 (H)  12.0 - 15.0 g/dL Final  ? HCT 03/10/2021 46.3 (H)  36.0 - 46.0 % Final  ? MCV 03/10/2021 85.4  80.0 - 100.0 fL Final  ? MCH 03/10/2021 28.8  26.0 - 34.0 pg Final  ? MCHC 03/10/2021 33.7  30.0 - 36.0 g/dL Final  ? RDW 03/10/2021 13.1  11.5 - 15.5 % Final  ? Platelets 03/10/2021 250  150 - 400 K/uL Final  ? nRBC 03/10/2021 0.0  0.0 - 0.2 % Final  ? Neutrophils Relative % 03/10/2021 57  % Final  ? Neutro Abs 03/10/2021 4.1  1.7 - 7.7 K/uL Final  ? Lymphocytes Relative 03/10/2021 28  % Final  ? Lymphs Abs 03/10/2021 2.0  0.7 - 4.0 K/uL Final  ? Monocytes Relative 03/10/2021  7  % Final  ? Monocytes Absolute 03/10/2021 0.5  0.1 - 1.0 K/uL Final  ? Eosinophils Relative 03/10/2021 6  % Final  ? Eosinophils Absolute 03/10/2021 0.5  0.0 - 0.5 K/uL Final  ? Basophils Relative 03/10/2021 1  % Final  ? Basophils Absolute 03/10/2021 0.1  0.0 - 0.1 K/uL Final  ? Immature Granulocytes 03/10/2021 1  % Final  ? Abs Immature Granulocytes 03/10/2021 0.04  0.00 - 0.07 K/uL Final  ? Performed at [facility], [address], [place], [place] [address]  ? ? ?Assessment:  Erin Howe is a 51 y.o. female s/p gastric bypass surgery (2011) with iron deficiency anemia and B12 deficiency.  She developed microcytic anemia in 2012. ? ?  Plan: ? ?Clinically she appears stable.  Denies any bleeding.  Reports eating a variety of foods in her diet.  Hemoglobin is 15.6 but she has really never been anemic when iron levels are low.  We will wait for iron levels to result prior to scheduling IV iron.  Ferritin today is 56 May and iron saturations are 23%.  Previously received IV iron for iron stores less than 30.  We will go ahead and get her set up for 5 doses of IV Venofer. ? ?Disposition- ?We will touch base once iron studies are available.  She is available after 1:00 each day for iron infusion.  Return to clinic in 3 months for repeat lab work (CBC, iron studies and ferritin) and in 6 months for MD/labs and possible IV iron. ? ?I spent 15 minutes dedicated to the care of this patient (face-to-face and non-face-to-face) on the date of the encounter to include what is described in the assessment and plan. ? ?Faythe Casa, NP ?03/10/2021 3:34 PM ? ?

## 2021-03-10 NOTE — Progress Notes (Signed)
Pt in for follow up, denies any concerns today. 

## 2021-03-16 ENCOUNTER — Inpatient Hospital Stay: Payer: 59

## 2021-03-18 ENCOUNTER — Inpatient Hospital Stay: Payer: 59

## 2021-03-21 ENCOUNTER — Inpatient Hospital Stay: Payer: 59

## 2021-03-21 ENCOUNTER — Other Ambulatory Visit: Payer: Self-pay | Admitting: Oncology

## 2021-03-21 ENCOUNTER — Other Ambulatory Visit: Payer: Self-pay

## 2021-03-21 VITALS — BP 128/76 | HR 76 | Temp 97.4°F | Resp 18

## 2021-03-21 DIAGNOSIS — Z9884 Bariatric surgery status: Secondary | ICD-10-CM | POA: Diagnosis not present

## 2021-03-21 DIAGNOSIS — D508 Other iron deficiency anemias: Secondary | ICD-10-CM

## 2021-03-21 DIAGNOSIS — E559 Vitamin D deficiency, unspecified: Secondary | ICD-10-CM | POA: Diagnosis not present

## 2021-03-21 DIAGNOSIS — D509 Iron deficiency anemia, unspecified: Secondary | ICD-10-CM | POA: Diagnosis not present

## 2021-03-21 DIAGNOSIS — D803 Selective deficiency of immunoglobulin G [IgG] subclasses: Secondary | ICD-10-CM | POA: Diagnosis not present

## 2021-03-21 DIAGNOSIS — E538 Deficiency of other specified B group vitamins: Secondary | ICD-10-CM | POA: Diagnosis not present

## 2021-03-21 MED ORDER — SODIUM CHLORIDE 0.9 % IV SOLN
Freq: Once | INTRAVENOUS | Status: AC
  Filled 2021-03-21: qty 250

## 2021-03-21 MED ORDER — IRON SUCROSE 20 MG/ML IV SOLN
200.0000 mg | Freq: Once | INTRAVENOUS | Status: AC
  Administered 2021-03-21: 200 mg via INTRAVENOUS

## 2021-03-21 MED ORDER — SODIUM CHLORIDE 0.9 % IV SOLN: 200.0000 mg | INTRAVENOUS | Status: DC

## 2021-03-23 ENCOUNTER — Other Ambulatory Visit: Payer: Self-pay

## 2021-03-23 ENCOUNTER — Inpatient Hospital Stay: Payer: 59

## 2021-03-23 VITALS — BP 128/71 | HR 78 | Temp 97.5°F

## 2021-03-23 DIAGNOSIS — E538 Deficiency of other specified B group vitamins: Secondary | ICD-10-CM | POA: Diagnosis not present

## 2021-03-23 DIAGNOSIS — E559 Vitamin D deficiency, unspecified: Secondary | ICD-10-CM | POA: Diagnosis not present

## 2021-03-23 DIAGNOSIS — D509 Iron deficiency anemia, unspecified: Secondary | ICD-10-CM | POA: Diagnosis not present

## 2021-03-23 DIAGNOSIS — D508 Other iron deficiency anemias: Secondary | ICD-10-CM

## 2021-03-23 DIAGNOSIS — Z9884 Bariatric surgery status: Secondary | ICD-10-CM | POA: Diagnosis not present

## 2021-03-23 DIAGNOSIS — D803 Selective deficiency of immunoglobulin G [IgG] subclasses: Secondary | ICD-10-CM | POA: Diagnosis not present

## 2021-03-23 MED ORDER — IRON SUCROSE 20 MG/ML IV SOLN
200.0000 mg | Freq: Once | INTRAVENOUS | Status: AC
  Administered 2021-03-23: 200 mg via INTRAVENOUS
  Filled 2021-03-23: qty 10

## 2021-03-23 MED ORDER — SODIUM CHLORIDE 0.9 % IV SOLN: 200.0000 mg | INTRAVENOUS | Status: DC

## 2021-03-23 MED ORDER — SODIUM CHLORIDE 0.9 % IV SOLN
Freq: Once | INTRAVENOUS | Status: AC
  Filled 2021-03-23: qty 250

## 2021-03-23 NOTE — Patient Instructions (Signed)

## 2021-03-28 ENCOUNTER — Inpatient Hospital Stay: Payer: 59

## 2021-03-28 ENCOUNTER — Other Ambulatory Visit: Payer: Self-pay

## 2021-03-28 VITALS — BP 139/87 | HR 79 | Temp 97.4°F | Resp 18

## 2021-03-28 DIAGNOSIS — E559 Vitamin D deficiency, unspecified: Secondary | ICD-10-CM | POA: Diagnosis not present

## 2021-03-28 DIAGNOSIS — E538 Deficiency of other specified B group vitamins: Secondary | ICD-10-CM | POA: Diagnosis not present

## 2021-03-28 DIAGNOSIS — Z9884 Bariatric surgery status: Secondary | ICD-10-CM | POA: Diagnosis not present

## 2021-03-28 DIAGNOSIS — K9589 Other complications of other bariatric procedure: Secondary | ICD-10-CM

## 2021-03-28 DIAGNOSIS — D509 Iron deficiency anemia, unspecified: Secondary | ICD-10-CM | POA: Diagnosis not present

## 2021-03-28 DIAGNOSIS — D803 Selective deficiency of immunoglobulin G [IgG] subclasses: Secondary | ICD-10-CM | POA: Diagnosis not present

## 2021-03-28 MED ORDER — IRON SUCROSE 20 MG/ML IV SOLN
200.0000 mg | Freq: Once | INTRAVENOUS | Status: AC
  Administered 2021-03-28: 200 mg via INTRAVENOUS
  Filled 2021-03-28: qty 10

## 2021-03-28 MED ORDER — SODIUM CHLORIDE 0.9 % IV SOLN
Freq: Once | INTRAVENOUS | Status: AC
  Filled 2021-03-28: qty 250

## 2021-03-28 MED ORDER — SODIUM CHLORIDE 0.9 % IV SOLN: 200.0000 mg | INTRAVENOUS | Status: DC

## 2021-03-28 NOTE — Patient Instructions (Signed)

## 2021-03-31 ENCOUNTER — Inpatient Hospital Stay: Payer: 59

## 2021-03-31 VITALS — BP 136/90 | HR 79 | Temp 96.9°F | Resp 18

## 2021-03-31 DIAGNOSIS — D803 Selective deficiency of immunoglobulin G [IgG] subclasses: Secondary | ICD-10-CM | POA: Diagnosis not present

## 2021-03-31 DIAGNOSIS — E538 Deficiency of other specified B group vitamins: Secondary | ICD-10-CM | POA: Diagnosis not present

## 2021-03-31 DIAGNOSIS — D509 Iron deficiency anemia, unspecified: Secondary | ICD-10-CM | POA: Diagnosis not present

## 2021-03-31 DIAGNOSIS — E559 Vitamin D deficiency, unspecified: Secondary | ICD-10-CM | POA: Diagnosis not present

## 2021-03-31 DIAGNOSIS — Z9884 Bariatric surgery status: Secondary | ICD-10-CM | POA: Diagnosis not present

## 2021-03-31 DIAGNOSIS — K9589 Other complications of other bariatric procedure: Secondary | ICD-10-CM

## 2021-03-31 MED ORDER — SODIUM CHLORIDE 0.9 % IV SOLN
Freq: Once | INTRAVENOUS | Status: AC
  Filled 2021-03-31: qty 250

## 2021-03-31 MED ORDER — SODIUM CHLORIDE 0.9 % IV SOLN: 200.0000 mg | INTRAVENOUS | Status: DC

## 2021-03-31 MED ORDER — IRON SUCROSE 20 MG/ML IV SOLN
200.0000 mg | Freq: Once | INTRAVENOUS | Status: AC
  Administered 2021-03-31: 200 mg via INTRAVENOUS
  Filled 2021-03-31: qty 10

## 2021-04-07 ENCOUNTER — Inpatient Hospital Stay: Payer: 59 | Attending: Oncology

## 2021-04-07 VITALS — BP 152/92 | HR 78 | Temp 97.2°F | Resp 18

## 2021-04-07 DIAGNOSIS — D509 Iron deficiency anemia, unspecified: Secondary | ICD-10-CM | POA: Insufficient documentation

## 2021-04-07 DIAGNOSIS — D508 Other iron deficiency anemias: Secondary | ICD-10-CM

## 2021-04-07 MED ORDER — SODIUM CHLORIDE 0.9 % IV SOLN: 200.0000 mg | INTRAVENOUS | Status: DC

## 2021-04-07 MED ORDER — SODIUM CHLORIDE 0.9 % IV SOLN
Freq: Once | INTRAVENOUS | Status: AC
  Filled 2021-04-07: qty 250

## 2021-04-07 MED ORDER — IRON SUCROSE 20 MG/ML IV SOLN
200.0000 mg | Freq: Once | INTRAVENOUS | Status: AC
  Administered 2021-04-07: 200 mg via INTRAVENOUS
  Filled 2021-04-07: qty 10

## 2021-04-07 NOTE — Patient Instructions (Signed)
[facility]  Discharge Instructions: ?Thank you for choosing [facility] to provide your oncology and hematology care.  ?If you have a lab appointment with the Cancer Center, please go directly to the Cancer Center and check in at the registration area. ? ?Wear comfortable clothing and clothing appropriate for easy access to any Portacath or PICC line.  ? ?We strive to give you quality time with your provider. You may need to reschedule your appointment if you arrive late (15 or more minutes).  Arriving late affects you and other patients whose appointments are after yours.  Also, if you miss three or more appointments without notifying the office, you may be dismissed from the clinic at the provider?s discretion.    ?  ?For prescription refill requests, have your pharmacy contact our office and allow 72 hours for refills to be completed.   ? ?Today you received the following chemotherapy and/or immunotherapy agents VENOFER    ?  ?To help prevent nausea and vomiting after your treatment, we encourage you to take your nausea medication as directed. ? ?BELOW ARE SYMPTOMS THAT SHOULD BE REPORTED IMMEDIATELY: ?*FEVER GREATER THAN 100.4 F (38 ?C) OR HIGHER ?*CHILLS OR SWEATING ?*NAUSEA AND VOMITING THAT IS NOT CONTROLLED WITH YOUR NAUSEA MEDICATION ?*UNUSUAL SHORTNESS OF BREATH ?*UNUSUAL BRUISING OR BLEEDING ?*URINARY PROBLEMS (pain or burning when urinating, or frequent urination) ?*BOWEL PROBLEMS (unusual diarrhea, constipation, pain near the anus) ?TENDERNESS IN MOUTH AND THROAT WITH OR WITHOUT PRESENCE OF ULCERS (sore throat, sores in mouth, or a toothache) ?UNUSUAL RASH, SWELLING OR PAIN  ?UNUSUAL VAGINAL DISCHARGE OR ITCHING  ? ?Items with * indicate a potential emergency and should be followed up as soon as possible or go to the Emergency Department if any problems should occur. ? ?Please show the CHEMOTHERAPY ALERT CARD or IMMUNOTHERAPY ALERT CARD at check-in to the  Emergency Department and triage nurse. ? ?Should you have questions after your visit or need to cancel or reschedule your appointment, please contact [facility]  [phone] and follow the prompts.  Office hours are 8:00 a.m. to 4:30 p.m. Monday - Friday. Please note that voicemails left after 4:00 p.m. may not be returned until the following business day.  We are closed weekends and major holidays. You have access to a nurse at all times for urgent questions. Please call the main number to the clinic [phone] and follow the prompts. ? ?For any non-urgent questions, you may also contact your provider using MyChart. We now offer e-Visits for anyone 18 and older to request care online for non-urgent symptoms. For details visit [email or web address]. ?  ?Also download the MyChart app! Go to the app store, search "MyChart", open the app, select [facility], and log in with your MyChart username and password. ? ?Due to Covid, a mask is required upon entering the hospital/clinic. If you do not have a mask, one will be given to you upon arrival. For doctor visits, patients may have 1 support person aged 18 or older with them. For treatment visits, patients cannot have anyone with them due to current Covid guidelines and our immunocompromised population.  ? ?Iron Sucrose Injection ?What is this medication? ?IRON SUCROSE (EYE ern SOO krose) treats low levels of iron (iron deficiency anemia) in people with kidney disease. Iron is a mineral that plays an important role in making red blood cells, which carry oxygen from your lungs to the rest of your body. ?This medicine may   be used for other purposes; ask your health care provider or pharmacist if you have questions. ?COMMON BRAND NAME(S): Venofer ?What should I tell my care team before I take this medication? ?They need to know if you have any of these conditions: ?Anemia not caused by low iron levels ?Heart disease ?High levels of  iron in the blood ?Kidney disease ?Liver disease ?An unusual or allergic reaction to iron, other medications, foods, dyes, or preservatives ?Pregnant or trying to get pregnant ?Breast-feeding ?How should I use this medication? ?This medication is for infusion into a vein. It is given in a hospital or clinic setting. ?Talk to your care team about the use of this medication in children. While this medication may be prescribed for children as young as 2 years for selected conditions, precautions do apply. ?Overdosage: If you think you have taken too much of this medicine contact a poison control center or emergency room at once. ?NOTE: This medicine is only for you. Do not share this medicine with others. ?What if I miss a dose? ?It is important not to miss your dose. Call your care team if you are unable to keep an appointment. ?What may interact with this medication? ?Do not take this medication with any of the following: ?Deferoxamine ?Dimercaprol ?Other iron products ?This medication may also interact with the following: ?Chloramphenicol ?Deferasirox ?This list may not describe all possible interactions. Give your health care provider a list of all the medicines, herbs, non-prescription drugs, or dietary supplements you use. Also tell them if you smoke, drink alcohol, or use illegal drugs. Some items may interact with your medicine. ?What should I watch for while using this medication? ?Visit your care team regularly. Tell your care team if your symptoms do not start to get better or if they get worse. You may need blood work done while you are taking this medication. ?You may need to follow a special diet. Talk to your care team. Foods that contain iron include: whole grains/cereals, dried fruits, beans, or peas, leafy green vegetables, and organ meats (liver, kidney). ?What side effects may I notice from receiving this medication? ?Side effects that you should report to your care team as soon as  possible: ?Allergic reactions--skin rash, itching, hives, swelling of the face, lips, tongue, or throat ?Low blood pressure--dizziness, feeling faint or lightheaded, blurry vision ?Shortness of breath ?Side effects that usually do not require medical attention (report to your care team if they continue or are bothersome): ?Flushing ?Headache ?Joint pain ?Muscle pain ?Nausea ?Pain, redness, or irritation at injection site ?This list may not describe all possible side effects. Call your doctor for medical advice about side effects. You may report side effects to FDA at 1-800-FDA-1088. ?Where should I keep my medication? ?This medication is given in a hospital or clinic and will not be stored at home. ?NOTE: This sheet is a summary. It may not cover all possible information. If you have questions about this medicine, talk to your doctor, pharmacist, or health care provider. ?? 2022 Elsevier/Gold Standard (2020-05-14 00:00:00) ? ?

## 2021-04-20 DIAGNOSIS — R69 Illness, unspecified: Secondary | ICD-10-CM | POA: Diagnosis not present

## 2021-04-29 ENCOUNTER — Other Ambulatory Visit: Payer: Self-pay | Admitting: Family Medicine

## 2021-04-29 DIAGNOSIS — Z1231 Encounter for screening mammogram for malignant neoplasm of breast: Secondary | ICD-10-CM

## 2021-05-10 ENCOUNTER — Ambulatory Visit
Admission: RE | Admit: 2021-05-10 | Discharge: 2021-05-10 | Disposition: A | Discharge status: Home / Self Care | Payer: 59 | Source: Ambulatory Visit | Attending: Family Medicine | Admitting: Family Medicine

## 2021-05-10 DIAGNOSIS — Z1231 Encounter for screening mammogram for malignant neoplasm of breast: Secondary | ICD-10-CM | POA: Diagnosis not present

## 2021-05-23 DIAGNOSIS — H538 Other visual disturbances: Secondary | ICD-10-CM | POA: Diagnosis not present

## 2021-06-10 ENCOUNTER — Ambulatory Visit: Payer: 59 | Admitting: Oncology

## 2021-06-10 ENCOUNTER — Other Ambulatory Visit: Payer: 59

## 2021-06-10 ENCOUNTER — Ambulatory Visit: Payer: 59

## 2021-06-17 ENCOUNTER — Ambulatory Visit: Payer: 59

## 2021-06-17 ENCOUNTER — Other Ambulatory Visit: Payer: 59

## 2021-06-17 ENCOUNTER — Ambulatory Visit: Payer: 59 | Admitting: Nurse Practitioner

## 2021-07-04 ENCOUNTER — Encounter: Payer: Self-pay | Admitting: Medical Oncology

## 2021-07-04 ENCOUNTER — Encounter: Payer: Self-pay | Admitting: Nurse Practitioner

## 2021-07-04 ENCOUNTER — Inpatient Hospital Stay (HOSPITAL_BASED_OUTPATIENT_CLINIC_OR_DEPARTMENT_OTHER): Payer: Managed Care, Other (non HMO) | Admitting: Medical Oncology

## 2021-07-04 ENCOUNTER — Other Ambulatory Visit: Payer: Self-pay

## 2021-07-04 ENCOUNTER — Inpatient Hospital Stay: Payer: Managed Care, Other (non HMO)

## 2021-07-04 ENCOUNTER — Inpatient Hospital Stay: Payer: Managed Care, Other (non HMO) | Attending: Oncology

## 2021-07-04 VITALS — BP 130/74 | HR 82 | Temp 97.5°F | Resp 20 | Ht 66.0 in | Wt 265.6 lb

## 2021-07-04 DIAGNOSIS — R5383 Other fatigue: Secondary | ICD-10-CM | POA: Diagnosis not present

## 2021-07-04 DIAGNOSIS — K9589 Other complications of other bariatric procedure: Secondary | ICD-10-CM | POA: Diagnosis not present

## 2021-07-04 DIAGNOSIS — D509 Iron deficiency anemia, unspecified: Secondary | ICD-10-CM | POA: Insufficient documentation

## 2021-07-04 DIAGNOSIS — Z79899 Other long term (current) drug therapy: Secondary | ICD-10-CM | POA: Diagnosis not present

## 2021-07-04 DIAGNOSIS — D508 Other iron deficiency anemias: Secondary | ICD-10-CM | POA: Diagnosis not present

## 2021-07-04 DIAGNOSIS — E538 Deficiency of other specified B group vitamins: Secondary | ICD-10-CM | POA: Insufficient documentation

## 2021-07-04 LAB — CBC WITH DIFFERENTIAL/PLATELET
Abs Immature Granulocytes: 0.02 10*3/uL (ref 0.00–0.07)
Basophils Absolute: 0 10*3/uL (ref 0.0–0.1)
Basophils Relative: 1 %
Eosinophils Absolute: 0.2 10*3/uL (ref 0.0–0.5)
Eosinophils Relative: 4 %
HCT: 51.6 % — ABNORMAL HIGH (ref 36.0–46.0)
Hemoglobin: 17.1 g/dL — ABNORMAL HIGH (ref 12.0–15.0)
Immature Granulocytes: 0 %
Lymphocytes Relative: 23 %
Lymphs Abs: 1.4 10*3/uL (ref 0.7–4.0)
MCH: 28.8 pg (ref 26.0–34.0)
MCHC: 33.1 g/dL (ref 30.0–36.0)
MCV: 87 fL (ref 80.0–100.0)
Monocytes Absolute: 0.4 10*3/uL (ref 0.1–1.0)
Monocytes Relative: 6 %
Neutro Abs: 4.3 10*3/uL (ref 1.7–7.7)
Neutrophils Relative %: 66 %
Platelets: 206 10*3/uL (ref 150–400)
RBC: 5.93 MIL/uL — ABNORMAL HIGH (ref 3.87–5.11)
RDW: 13.2 % (ref 11.5–15.5)
WBC: 6.4 10*3/uL (ref 4.0–10.5)
nRBC: 0 % (ref 0.0–0.2)

## 2021-07-04 LAB — FERRITIN: Ferritin: 97 ng/mL (ref 11–307)

## 2021-07-04 LAB — IRON AND TIBC
Iron: 85 ug/dL (ref 28–170)
Saturation Ratios: 33 % — ABNORMAL HIGH (ref 10.4–31.8)
TIBC: 255 ug/dL (ref 250–450)
UIBC: 170 ug/dL

## 2021-07-04 NOTE — Progress Notes (Signed)
Ferritin 97, Hemoglobin 17.1, and iron Saturation 33. Per Clent Jacks PA, hold iron at this time, pt to follow up in 3 months.

## 2021-07-04 NOTE — Progress Notes (Signed)
[facility]  [address], [address] [place], [place] [address] Phone: [phone]  Fax: [phone]   Clinic Day:  07/04/2021  Referring physician: Sharyne Peach, MD  Chief Complaint: Erin Howe is a 51 y.o. female s/p gastric bypass surgery with iron deficiency anemia, Vitamin D deficiency, and B-12 deficiency who is seen for new patient assessment.  Related History:  The patient underwent gastric bypass surgery in 2010. She has lost over 200 pounds. She was diagnosed with progressive microcytic iron deficiency anemia and B-12 deficiency in 2012.  She was initially seen by Dr. Alvy Howe on 11/24/2015. She was subsequently followed by Dr. Janese Howe who patient last saw in 2019. Dr. Mike Howe then saw patient in 2020.   She has received B-12 injections at home for several years - currently taking monthly. Oral iron was discontinued. Patient was previously receiving iron therapy with Feraheme (last 12/29/2016). Vitamin D deficiency was followed by her PCP but was ultimately discontinued.  Interval History- Erin Howe is a 51 year old female who presents back today for follow-up for iron deficiency anemia.  Previously tolerated Venofer well. Is tired- feels like her iron may be low.    Past Medical History:  Diagnosis Date   Anemia     Past Surgical History:  Procedure Laterality Date   CHOLECYSTECTOMY     GASTRIC BYPASS  02-03-08   REFRACTIVE SURGERY  2006   WISDOM TOOTH EXTRACTION      Family History  Problem Relation Age of Onset   Atrial fibrillation Mother    Heart failure Mother    Deep vein thrombosis Mother    Diabetes Mother    Hyperlipidemia Mother    Hypertension Mother    Hyperlipidemia Father    Hypertension Father    Diabetes Sister    Stroke Maternal Grandfather    Heart disease Maternal Grandmother    Breast cancer Cousin        cousins both sides    Social History:  reports that she has never smoked. She has quit using  smokeless tobacco. She reports that she does not drink alcohol and does not use drugs. She does not smoke or drink alcohol. She denies any known exposures to radiations or toxins. She works full-time for [employer] (from home) and part-time for [employer] and [employer]. She lives in [place]. The patient is alone today.  Allergies:  Allergies  Allergen Reactions   Other Other (See Comments)    Walnuts and Pecans;  History of Gastric Bypass pass   Amoxicillin    Penicillins Swelling   Tetanus Toxoid    Tetanus Toxoids     Current Medications: Current Outpatient Medications  Medication Sig Dispense Refill   albuterol (PROVENTIL) (2.5 MG/3ML) 0.083% nebulizer solution Inhale 2.5 mg into the lungs every 4 (four) hours as needed.      amphetamine-dextroamphetamine (ADDERALL) 30 MG tablet Take 30 mg by mouth 2 (two) times daily.     clonazePAM (KLONOPIN) 0.5 MG tablet TAKE 1 TABLET BY MOUTH NIGHTLY AS NEEDED FOR ANXIETY  2   cyanocobalamin (,VITAMIN B-12,) 1000 MCG/ML injection INJECT 1 ML (1,000 MCG TOTAL) INTO THE SKIN EVERY 30 (THIRTY) DAYS. 3 mL 3   cyclobenzaprine (FLEXERIL) 5 MG tablet Take 1-2 tablets (5-10 mg total) by mouth 3 (three) times daily as needed for muscle spasms. 20 tablet 0   ipratropium (ATROVENT) 0.02 % nebulizer solution SMARTSIG:2.5 Milliliter(s) Via Nebulizer 4 Times Daily     loratadine (CLARITIN) 10 MG tablet Take  1 tablet (10 mg total) by mouth daily. 30 tablet 11   meloxicam (MOBIC) 7.5 MG tablet Take 7.5 mg by mouth as needed.      omeprazole (PRILOSEC) 40 MG capsule Take 1 capsule (40 mg total) by mouth daily. 90 capsule 3   SYRINGE-NEEDLE, DISP, 3 ML (BD INTEGRA SYRINGE) 25G X 1" 3 ML MISC Use for B 12 injections every 30 days 3 each 3   No current facility-administered medications for this visit.    Review of Systems  Constitutional:  Positive for malaise/fatigue. Negative for chills, fever and weight loss.  HENT:  Negative for congestion, ear pain and  tinnitus.   Eyes: Negative.  Negative for blurred vision and double vision.  Respiratory: Negative.  Negative for cough, sputum production and shortness of breath.   Cardiovascular: Negative.  Negative for chest pain, palpitations and leg swelling.  Gastrointestinal: Negative.  Negative for abdominal pain, constipation, diarrhea, nausea and vomiting.  Genitourinary:  Negative for dysuria, frequency and urgency.  Musculoskeletal:  Negative for back pain and falls.  Skin: Negative.  Negative for rash.  Neurological: Negative.  Negative for weakness and headaches.  Endo/Heme/Allergies: Negative.  Does not bruise/bleed easily.  Psychiatric/Behavioral:  Negative for depression. The patient has insomnia. The patient is not nervous/anxious.    Performance status (ECOG): 2  Vitals Blood pressure 130/74, pulse 82, temperature (!) 97.5 F (36.4 C), temperature source Tympanic, resp. rate 20, height 5\' 6"  (1.676 m), weight 265 lb 9.6 oz (120.5 kg), last menstrual period 08/26/2017.   Physical Exam Vitals and nursing note reviewed.  Constitutional:      Appearance: Normal appearance. She is obese.  Eyes:     Comments: No pallor noted   Cardiovascular:     Rate and Rhythm: Normal rate and regular rhythm.     Heart sounds: Normal heart sounds.  Pulmonary:     Effort: Pulmonary effort is normal.     Breath sounds: Normal breath sounds.  Skin:    General: Skin is warm.     Capillary Refill: Capillary refill takes less than 2 seconds.  Neurological:     Mental Status: She is alert and oriented to person, place, and time.     Appointment on 07/04/2021  Component Date Value Ref Range Status   WBC 07/04/2021 6.4  4.0 - 10.5 K/uL Final   RBC 07/04/2021 5.93 (H)  3.87 - 5.11 MIL/uL Final   Hemoglobin 07/04/2021 17.1 (H)  12.0 - 15.0 g/dL Final   HCT 09/04/2021 51.6 (H)  36.0 - 46.0 % Final   MCV 07/04/2021 87.0  80.0 - 100.0 fL Final   MCH 07/04/2021 28.8  26.0 - 34.0 pg Final   MCHC  07/04/2021 33.1  30.0 - 36.0 g/dL Final   RDW 09/04/2021 13.2  11.5 - 15.5 % Final   Platelets 07/04/2021 206  150 - 400 K/uL Final   nRBC 07/04/2021 0.0  0.0 - 0.2 % Final   Neutrophils Relative % 07/04/2021 66  % Final   Neutro Abs 07/04/2021 4.3  1.7 - 7.7 K/uL Final   Lymphocytes Relative 07/04/2021 23  % Final   Lymphs Abs 07/04/2021 1.4  0.7 - 4.0 K/uL Final   Monocytes Relative 07/04/2021 6  % Final   Monocytes Absolute 07/04/2021 0.4  0.1 - 1.0 K/uL Final   Eosinophils Relative 07/04/2021 4  % Final   Eosinophils Absolute 07/04/2021 0.2  0.0 - 0.5 K/uL Final   Basophils Relative 07/04/2021 1  % Final  Basophils Absolute 07/04/2021 0.0  0.0 - 0.1 K/uL Final   Immature Granulocytes 07/04/2021 0  % Final   Abs Immature Granulocytes 07/04/2021 0.02  0.00 - 0.07 K/uL Final   Performed at [facility], [address] [place], [place] [address]   Iron 07/04/2021 85  28 - 170 ug/dL Final   TIBC 03/00/9233 255  250 - 450 ug/dL Final   Saturation Ratios 07/04/2021 33 (H)  10.4 - 31.8 % Final   UIBC 07/04/2021 170  ug/dL Final   Performed at [facility], [address] [place], [place] [address]   Ferritin 07/04/2021 97  11 - 307 ng/mL Final   Performed at [facility], [address] [place] [place], [place] [address]    Assessment:  Marquesha Robideau is a 51 y.o. female s/p gastric bypass surgery (2011) with iron deficiency anemia and B12 deficiency.  She developed microcytic anemia in 2012.  Plan:  Clinically she appears stable.  Denies any bleeding.  Reports eating a variety of foods in her diet.  Hemoglobin is 17.1 today with ferritin of 97 and iron stores of 33% iron saturation. Previously received IV iron for iron stores less than 30. RTC 3 months.    I spent 15 minutes dedicated to the care of this patient (face-to-face and non-face-to-face) on the date of the encounter to include what is described in the assessment and plan.  Clent Jacks  PA-C 07/04/2021 3:26 PM

## 2021-09-27 ENCOUNTER — Encounter: Payer: Self-pay | Admitting: Nurse Practitioner

## 2021-10-03 ENCOUNTER — Other Ambulatory Visit: Payer: Self-pay

## 2021-10-03 DIAGNOSIS — K9589 Other complications of other bariatric procedure: Secondary | ICD-10-CM

## 2021-10-04 ENCOUNTER — Inpatient Hospital Stay: Payer: 59

## 2021-10-05 ENCOUNTER — Encounter: Payer: Self-pay | Admitting: Oncology

## 2021-10-05 ENCOUNTER — Inpatient Hospital Stay: Payer: 59 | Admitting: Oncology

## 2021-10-05 ENCOUNTER — Inpatient Hospital Stay: Payer: 59

## 2021-10-05 ENCOUNTER — Encounter: Payer: Self-pay | Admitting: Nurse Practitioner

## 2021-11-04 ENCOUNTER — Other Ambulatory Visit: Payer: Self-pay | Admitting: Oncology

## 2021-11-04 DIAGNOSIS — E538 Deficiency of other specified B group vitamins: Secondary | ICD-10-CM

## 2021-11-07 ENCOUNTER — Inpatient Hospital Stay (HOSPITAL_BASED_OUTPATIENT_CLINIC_OR_DEPARTMENT_OTHER): Payer: No Typology Code available for payment source | Admitting: Oncology

## 2021-11-07 ENCOUNTER — Inpatient Hospital Stay: Payer: No Typology Code available for payment source | Attending: Oncology

## 2021-11-07 ENCOUNTER — Inpatient Hospital Stay: Payer: No Typology Code available for payment source

## 2021-11-07 ENCOUNTER — Encounter: Payer: Self-pay | Admitting: Oncology

## 2021-11-07 ENCOUNTER — Inpatient Hospital Stay: Payer: 59 | Admitting: Oncology

## 2021-11-07 ENCOUNTER — Inpatient Hospital Stay: Payer: 59

## 2021-11-07 VITALS — BP 135/90 | HR 97 | Temp 96.3°F | Resp 19 | Wt 261.8 lb

## 2021-11-07 DIAGNOSIS — D508 Other iron deficiency anemias: Secondary | ICD-10-CM

## 2021-11-07 DIAGNOSIS — R5383 Other fatigue: Secondary | ICD-10-CM

## 2021-11-07 DIAGNOSIS — Z9884 Bariatric surgery status: Secondary | ICD-10-CM | POA: Insufficient documentation

## 2021-11-07 DIAGNOSIS — E611 Iron deficiency: Secondary | ICD-10-CM | POA: Diagnosis not present

## 2021-11-07 DIAGNOSIS — Z803 Family history of malignant neoplasm of breast: Secondary | ICD-10-CM

## 2021-11-07 DIAGNOSIS — D509 Iron deficiency anemia, unspecified: Secondary | ICD-10-CM | POA: Diagnosis not present

## 2021-11-07 DIAGNOSIS — D751 Secondary polycythemia: Secondary | ICD-10-CM

## 2021-11-07 DIAGNOSIS — E538 Deficiency of other specified B group vitamins: Secondary | ICD-10-CM | POA: Insufficient documentation

## 2021-11-07 LAB — CBC WITH DIFFERENTIAL/PLATELET
Abs Immature Granulocytes: 0.07 10*3/uL (ref 0.00–0.07)
Basophils Absolute: 0.1 10*3/uL (ref 0.0–0.1)
Basophils Relative: 1 %
Eosinophils Absolute: 0.6 10*3/uL — ABNORMAL HIGH (ref 0.0–0.5)
Eosinophils Relative: 6 %
HCT: 55.3 % — ABNORMAL HIGH (ref 36.0–46.0)
Hemoglobin: 18.3 g/dL — ABNORMAL HIGH (ref 12.0–15.0)
Immature Granulocytes: 1 %
Lymphocytes Relative: 20 %
Lymphs Abs: 1.8 10*3/uL (ref 0.7–4.0)
MCH: 28.3 pg (ref 26.0–34.0)
MCHC: 33.1 g/dL (ref 30.0–36.0)
MCV: 85.6 fL (ref 80.0–100.0)
Monocytes Absolute: 0.6 10*3/uL (ref 0.1–1.0)
Monocytes Relative: 7 %
Neutro Abs: 6 10*3/uL (ref 1.7–7.7)
Neutrophils Relative %: 65 %
Platelets: 277 10*3/uL (ref 150–400)
RBC: 6.46 MIL/uL — ABNORMAL HIGH (ref 3.87–5.11)
RDW: 13.2 % (ref 11.5–15.5)
WBC: 9.1 10*3/uL (ref 4.0–10.5)
nRBC: 0 % (ref 0.0–0.2)

## 2021-11-07 LAB — IRON AND TIBC
Iron: 92 ug/dL (ref 28–170)
Saturation Ratios: 30 % (ref 10.4–31.8)
TIBC: 308 ug/dL (ref 250–450)
UIBC: 216 ug/dL

## 2021-11-07 LAB — FERRITIN: Ferritin: 66 ng/mL (ref 11–307)

## 2021-11-07 NOTE — Progress Notes (Unsigned)
Patient states she is tired all the time.

## 2021-11-08 ENCOUNTER — Other Ambulatory Visit: Payer: Self-pay | Admitting: *Deleted

## 2021-11-08 ENCOUNTER — Encounter: Payer: Self-pay | Admitting: Nurse Practitioner

## 2021-11-08 ENCOUNTER — Telehealth: Payer: Self-pay | Admitting: *Deleted

## 2021-11-08 DIAGNOSIS — D751 Secondary polycythemia: Secondary | ICD-10-CM

## 2021-11-08 NOTE — Telephone Encounter (Signed)
-----   Message from Sindy Guadeloupe, MD sent at 11/07/2021  3:36 PM EST ----- Please have her come for jak 2 mutation testing and epo levels sometime this week or next week. Please have her pcp evaluate for OSA

## 2021-11-08 NOTE — Progress Notes (Signed)
Hematology/Oncology Consult note [facility]  [phone] [phone]  Patient Care Team: Rayetta Humphrey, MD as PCP - General (Family Medicine)   Name of the patient: Erin Howe  [id number]  03-Feb-1970   Date of visit: 11/08/21  Diagnosis-iron deficiency anemia probably due to gastric bypass  Chief complaint/ Reason for visit-routine follow-up of iron deficiency anemia  Heme/Onc history: Patient is a 51 year old female who was previously seen by Dr. Bertis Ruddy for iron deficiency anemia. She had normal CBC up until 2011. She then began to have progressive microcytic anemia starting 2012. Her hemoglobin was 8 with an MCV of 64 in August 2017. Ferritin was low at 3. She also has B12 deficiency and self-administered B12 injections at home. No routine use of NSAIDs. She has been on oral iron in the past with no improvement in her anemia. She does have a history of gastric bypass surgery in the past. Last dose of IV iron was in December 2017.    Interval history-patient reports ongoing fatigue.  Denies any new complaints at this time  ECOG PS- 1 Pain scale- 0  Review of systems- Review of Systems  Constitutional:  Positive for malaise/fatigue. Negative for chills, fever and weight loss.  HENT:  Negative for congestion, ear discharge and nosebleeds.   Eyes:  Negative for blurred vision.  Respiratory:  Negative for cough, hemoptysis, sputum production, shortness of breath and wheezing.   Cardiovascular:  Negative for chest pain, palpitations, orthopnea and claudication.  Gastrointestinal:  Negative for abdominal pain, blood in stool, constipation, diarrhea, heartburn, melena, nausea and vomiting.  Genitourinary:  Negative for dysuria, flank pain, frequency, hematuria and urgency.  Musculoskeletal:  Negative for back pain, joint pain and myalgias.  Skin:  Negative for rash.  Neurological:  Negative for dizziness, tingling, focal weakness,  seizures, weakness and headaches.  Endo/Heme/Allergies:  Does not bruise/bleed easily.  Psychiatric/Behavioral:  Negative for depression and suicidal ideas. The patient does not have insomnia.       Allergies  Allergen Reactions   Other Other (See Comments)    Walnuts and Pecans;  History of Gastric Bypass pass   Amoxicillin    Penicillins Swelling   Tetanus Toxoid    Tetanus Toxoids      Past Medical History:  Diagnosis Date   Anemia      Past Surgical History:  Procedure Laterality Date   CHOLECYSTECTOMY     GASTRIC BYPASS  02-03-08   REFRACTIVE SURGERY  2006   WISDOM TOOTH EXTRACTION      Social History   Socioeconomic History   Marital status: Married    Spouse name: Not on file   Number of children: Not on file   Years of education: Not on file   Highest education level: Not on file  Occupational History   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  Tobacco Use   Smoking status: Never   Smokeless tobacco: Former  Substance and Sexual Activity   Alcohol use: No   Drug use: No   Sexual activity: Yes    Partners: Male  Other Topics Concern   Not on file  Social History Narrative   Exercise-- walking, water aerobics   Social Determinants of Health   Financial Resource Strain: Not on file  Food Insecurity: Not on file  Transportation Needs: Not on file  Physical Activity: Not on file  Stress: Not on file  Social Connections: Not on file  Intimate Partner Violence: Not on file  Family History  Problem Relation Age of Onset   Atrial fibrillation Mother    Heart failure Mother    Deep vein thrombosis Mother    Diabetes Mother    Hyperlipidemia Mother    Hypertension Mother    Hyperlipidemia Father    Hypertension Father    Diabetes Sister    Stroke Maternal Grandfather    Heart disease Maternal Grandmother    Breast cancer Cousin        cousins both sides     Current Outpatient Medications:    amphetamine-dextroamphetamine (ADDERALL) 30  MG tablet, Take 30 mg by mouth 2 (two) times daily., Disp: , Rfl:    cyanocobalamin (VITAMIN B12) 1000 MCG/ML injection, INJECT 1 ML (1,000 MCG TOTAL) INTO THE SKIN EVERY 30 (THIRTY) DAYS., Disp: 1 mL, Rfl: 10   cyclobenzaprine (FLEXERIL) 5 MG tablet, Take 1-2 tablets (5-10 mg total) by mouth 3 (three) times daily as needed for muscle spasms., Disp: 20 tablet, Rfl: 0   ipratropium (ATROVENT) 0.02 % nebulizer solution, SMARTSIG:2.5 Milliliter(s) Via Nebulizer 4 Times Daily, Disp: , Rfl:    loratadine (CLARITIN) 10 MG tablet, Take 1 tablet (10 mg total) by mouth daily., Disp: 30 tablet, Rfl: 11   meloxicam (MOBIC) 7.5 MG tablet, Take 7.5 mg by mouth as needed. , Disp: , Rfl:    metFORMIN (GLUCOPHAGE-XR) 500 MG 24 hr tablet, Take by mouth., Disp: , Rfl:    omeprazole (PRILOSEC) 40 MG capsule, Take 1 capsule (40 mg total) by mouth daily., Disp: 90 capsule, Rfl: 3   SYRINGE-NEEDLE, DISP, 3 ML (BD INTEGRA SYRINGE) 25G X 1" 3 ML MISC, Use for B 12 injections every 30 days, Disp: 3 each, Rfl: 3   albuterol (PROVENTIL) (2.5 MG/3ML) 0.083% nebulizer solution, Inhale 2.5 mg into the lungs every 4 (four) hours as needed.  (Patient not taking: Reported on 11/07/2021), Disp: , Rfl:    clonazePAM (KLONOPIN) 0.5 MG tablet, TAKE 1 TABLET BY MOUTH NIGHTLY AS NEEDED FOR ANXIETY (Patient not taking: Reported on 11/07/2021), Disp: , Rfl: 2  Physical exam:  Vitals:   11/07/21 1351  BP: (!) 135/90  Pulse: 97  Resp: 19  Temp: (!) 96.3 F (35.7 C)  SpO2: 98%  Weight: 261 lb 12.8 oz (118.8 kg)   Physical Exam Constitutional:      General: She is not in acute distress. Cardiovascular:     Rate and Rhythm: Normal rate and regular rhythm.     Heart sounds: Normal heart sounds.  Pulmonary:     Effort: Pulmonary effort is normal.     Breath sounds: Normal breath sounds.  Skin:    General: Skin is warm and dry.  Neurological:     Mental Status: She is alert and oriented to person, place, and time.          Latest Ref Rng & Units 07/30/2020    1:12 PM  CMP  Glucose 70 - 99 mg/dL 732   BUN 6 - 20 mg/dL 11   Creatinine 2.02 - 1.00 mg/dL 5.42   Sodium 706 - 237 mmol/L 135   Potassium 3.5 - 5.1 mmol/L 4.3   Chloride 98 - 111 mmol/L 102   CO2 22 - 32 mmol/L 25   Calcium 8.9 - 10.3 mg/dL 8.7   Total Protein 6.5 - 8.1 g/dL 7.2   Total Bilirubin 0.3 - 1.2 mg/dL 0.2   Alkaline Phos 38 - 126 U/L 92   AST 15 - 41 U/L 23   ALT  0 - 44 U/L 22       Latest Ref Rng & Units 11/07/2021    1:35 PM  CBC  WBC 4.0 - 10.5 K/uL 9.1   Hemoglobin 12.0 - 15.0 g/dL 18.3   Hematocrit 36.0 - 46.0 % 55.3   Platelets 150 - 400 K/uL 277      Assessment and plan- Patient is a 51 y.o. female here for routine follow-up of iron deficiency  Patient's hemoglobin has been gradually trending up over the last 1 year.  It was 14.6 in November 2022 followed by 15.6 in March and presently at 18.3.  She therefore meets criteria for polycythemia.  I will plan to have her back sometime in the next couple of weeks to check for JAK2 mutation and EPO levels.  I suspect her polycythemia is secondary due to possible obstructive sleep apnea given that she is overweight.  I would like Dr. Iona Beard to evaluate her for obstructive sleep apnea as a cause of her fatigue.  She does not require any IV iron at this time.  Repeat CBC ferritin and iron studies in 4 and 8 months and I will see her back in 8 months   Visit Diagnosis 1. Polycythemia   2. Other fatigue   3. Iron deficiency      Dr. Randa Evens, MD, MPH [facility] at [facility] [phone] 11/08/2021 1:57 PM

## 2021-11-08 NOTE — Telephone Encounter (Signed)
Called pt and let her know that Dr. Janese Banks has seen her hgb and it has been trending up. Due to the hemoglobin being thick she would like additional labs to see if we can figure out what is causing the issue. Pt agreeable to that and when I asked about sleep study she states she had one a year ago with Dr . Lanney Gins.  I tried to find it on care everywhere but I will call office tom. To get results. Patient also says that se gives plasma 2 times a week. Pt. Says she will come and get extra labs on Friday at 1:15 pm

## 2021-11-11 ENCOUNTER — Inpatient Hospital Stay: Payer: No Typology Code available for payment source

## 2021-11-11 DIAGNOSIS — D751 Secondary polycythemia: Secondary | ICD-10-CM

## 2021-11-11 DIAGNOSIS — E611 Iron deficiency: Secondary | ICD-10-CM

## 2021-11-11 DIAGNOSIS — D509 Iron deficiency anemia, unspecified: Secondary | ICD-10-CM | POA: Diagnosis not present

## 2021-11-11 DIAGNOSIS — R5383 Other fatigue: Secondary | ICD-10-CM

## 2021-11-11 LAB — CBC
HCT: 46.4 % — ABNORMAL HIGH (ref 36.0–46.0)
Hemoglobin: 15.5 g/dL — ABNORMAL HIGH (ref 12.0–15.0)
MCH: 28.3 pg (ref 26.0–34.0)
MCHC: 33.4 g/dL (ref 30.0–36.0)
MCV: 84.8 fL (ref 80.0–100.0)
Platelets: 225 10*3/uL (ref 150–400)
RBC: 5.47 MIL/uL — ABNORMAL HIGH (ref 3.87–5.11)
RDW: 13 % (ref 11.5–15.5)
WBC: 7.3 10*3/uL (ref 4.0–10.5)
nRBC: 0 % (ref 0.0–0.2)

## 2021-11-11 LAB — IRON AND TIBC
Iron: 58 ug/dL (ref 28–170)
Saturation Ratios: 21 % (ref 10.4–31.8)
TIBC: 281 ug/dL (ref 250–450)
UIBC: 223 ug/dL

## 2021-11-11 LAB — FERRITIN: Ferritin: 56 ng/mL (ref 11–307)

## 2021-11-12 LAB — ERYTHROPOIETIN: Erythropoietin: 16 m[IU]/mL (ref 2.6–18.5)

## 2021-11-16 LAB — JAK2 GENOTYPR

## 2021-11-22 ENCOUNTER — Telehealth: Payer: Self-pay

## 2021-11-22 NOTE — Telephone Encounter (Signed)
Receievd a "CARE CONSIDERATION" from [employer] regarding pts diabetes; our office does not manage pts diabetes forwarding information to pts PCP (Dr. Greggory Stallion) at [phone] and receieved a fax confirmation.

## 2022-03-08 ENCOUNTER — Inpatient Hospital Stay: Payer: No Typology Code available for payment source

## 2022-03-15 ENCOUNTER — Inpatient Hospital Stay: Payer: No Typology Code available for payment source | Attending: Oncology

## 2022-04-19 ENCOUNTER — Other Ambulatory Visit: Payer: Self-pay | Admitting: Physician Assistant

## 2022-04-19 DIAGNOSIS — S46002A Unspecified injury of muscle(s) and tendon(s) of the rotator cuff of left shoulder, initial encounter: Secondary | ICD-10-CM

## 2022-04-22 ENCOUNTER — Ambulatory Visit
Admission: RE | Admit: 2022-04-22 | Discharge: 2022-04-22 | Disposition: A | Discharge status: Home / Self Care | Payer: No Typology Code available for payment source | Source: Ambulatory Visit | Attending: Physician Assistant | Admitting: Physician Assistant

## 2022-04-22 DIAGNOSIS — S46002A Unspecified injury of muscle(s) and tendon(s) of the rotator cuff of left shoulder, initial encounter: Secondary | ICD-10-CM | POA: Insufficient documentation

## 2022-04-24 ENCOUNTER — Other Ambulatory Visit: Payer: Self-pay | Admitting: Pulmonary Disease

## 2022-04-24 DIAGNOSIS — R911 Solitary pulmonary nodule: Secondary | ICD-10-CM

## 2022-04-25 ENCOUNTER — Other Ambulatory Visit: Payer: Self-pay | Admitting: Family Medicine

## 2022-04-25 DIAGNOSIS — Z1231 Encounter for screening mammogram for malignant neoplasm of breast: Secondary | ICD-10-CM

## 2022-04-26 ENCOUNTER — Ambulatory Visit
Admission: RE | Admit: 2022-04-26 | Discharge: 2022-04-26 | Disposition: A | Discharge status: Home / Self Care | Payer: No Typology Code available for payment source | Source: Ambulatory Visit | Attending: Pulmonary Disease | Admitting: Pulmonary Disease

## 2022-04-26 DIAGNOSIS — R911 Solitary pulmonary nodule: Secondary | ICD-10-CM | POA: Insufficient documentation

## 2022-04-26 MED ORDER — IOHEXOL 300 MG/ML  SOLN
75.0000 mL | Freq: Once | INTRAMUSCULAR | Status: AC | PRN
  Administered 2022-04-26: 75 mL via INTRAVENOUS

## 2022-05-18 ENCOUNTER — Ambulatory Visit
Admission: RE | Admit: 2022-05-18 | Discharge: 2022-05-18 | Disposition: A | Discharge status: Home / Self Care | Payer: No Typology Code available for payment source | Source: Ambulatory Visit | Attending: Family Medicine | Admitting: Family Medicine

## 2022-05-18 DIAGNOSIS — Z1231 Encounter for screening mammogram for malignant neoplasm of breast: Secondary | ICD-10-CM | POA: Diagnosis not present

## 2022-07-10 ENCOUNTER — Inpatient Hospital Stay: Payer: No Typology Code available for payment source | Admitting: Oncology

## 2022-07-10 ENCOUNTER — Inpatient Hospital Stay: Payer: No Typology Code available for payment source

## 2022-07-13 ENCOUNTER — Inpatient Hospital Stay: Payer: No Typology Code available for payment source | Admitting: Oncology

## 2022-07-13 ENCOUNTER — Inpatient Hospital Stay: Payer: No Typology Code available for payment source

## 2022-07-17 ENCOUNTER — Inpatient Hospital Stay (HOSPITAL_BASED_OUTPATIENT_CLINIC_OR_DEPARTMENT_OTHER): Payer: No Typology Code available for payment source | Admitting: Oncology

## 2022-07-17 ENCOUNTER — Inpatient Hospital Stay: Payer: No Typology Code available for payment source | Attending: Oncology

## 2022-07-17 ENCOUNTER — Encounter: Payer: Self-pay | Admitting: Oncology

## 2022-07-17 VITALS — BP 123/75 | HR 77 | Temp 97.1°F | Resp 18 | Ht 66.0 in | Wt 239.8 lb

## 2022-07-17 DIAGNOSIS — Z8639 Personal history of other endocrine, nutritional and metabolic disease: Secondary | ICD-10-CM | POA: Diagnosis not present

## 2022-07-17 DIAGNOSIS — R5383 Other fatigue: Secondary | ICD-10-CM

## 2022-07-17 DIAGNOSIS — E669 Obesity, unspecified: Secondary | ICD-10-CM | POA: Diagnosis not present

## 2022-07-17 DIAGNOSIS — E611 Iron deficiency: Secondary | ICD-10-CM

## 2022-07-17 DIAGNOSIS — D751 Secondary polycythemia: Secondary | ICD-10-CM | POA: Diagnosis present

## 2022-07-17 DIAGNOSIS — Z803 Family history of malignant neoplasm of breast: Secondary | ICD-10-CM | POA: Diagnosis not present

## 2022-07-17 DIAGNOSIS — Z87891 Personal history of nicotine dependence: Secondary | ICD-10-CM | POA: Diagnosis not present

## 2022-07-17 DIAGNOSIS — Z9884 Bariatric surgery status: Secondary | ICD-10-CM | POA: Insufficient documentation

## 2022-07-17 DIAGNOSIS — Z862 Personal history of diseases of the blood and blood-forming organs and certain disorders involving the immune mechanism: Secondary | ICD-10-CM | POA: Insufficient documentation

## 2022-07-17 LAB — IRON AND TIBC
Iron: 105 ug/dL (ref 28–170)
Saturation Ratios: 31 % (ref 10.4–31.8)
TIBC: 342 ug/dL (ref 250–450)
UIBC: 237 ug/dL

## 2022-07-17 LAB — CBC
HCT: 46.6 % — ABNORMAL HIGH (ref 36.0–46.0)
Hemoglobin: 15.3 g/dL — ABNORMAL HIGH (ref 12.0–15.0)
MCH: 28 pg (ref 26.0–34.0)
MCHC: 32.8 g/dL (ref 30.0–36.0)
MCV: 85.3 fL (ref 80.0–100.0)
Platelets: 251 10*3/uL (ref 150–400)
RBC: 5.46 MIL/uL — ABNORMAL HIGH (ref 3.87–5.11)
RDW: 13.5 % (ref 11.5–15.5)
WBC: 7 10*3/uL (ref 4.0–10.5)
nRBC: 0 % (ref 0.0–0.2)

## 2022-07-17 LAB — FERRITIN: Ferritin: 33 ng/mL (ref 11–307)

## 2022-07-17 NOTE — Progress Notes (Signed)
Hematology/Oncology Consult note [facility]  [phone] [phone]  Patient Care Team: Rayetta Humphrey, MD as PCP - General (Family Medicine)   Name of the patient: Erin Howe  [id number]  [birth year]   Date of visit: 07/17/22  Diagnosis-polycythemia likely secondary History of iron deficiency  Chief complaint/ Reason for visit-routine follow-up of polycythemia  Heme/Onc history: Patient is a 52 year old female who was previously seen by Dr. Bertis Ruddy for iron deficiency anemia. She had normal CBC up until 2011. She then began to have progressive microcytic anemia starting 2012. Her hemoglobin was 8 with an MCV of 64 in August 2017. Ferritin was low at 3. She also has B12 deficiency and self-administered B12 injections at home. No routine use of NSAIDs. She has been on oral iron in the past with no improvement in her anemia. She does have a history of gastric bypass surgery in the past. Last dose of IV iron was in December 2017.   Interval history-patient is currently trying to lose weight.  She has baseline chronic fatigue but denies other complaints at this time.  She gets about 5 hours of sleep but says that she sleeps well and that time  ECOG PS- 1 Pain scale- 0   Review of systems- Review of Systems  Constitutional:  Positive for malaise/fatigue. Negative for chills, fever and weight loss.  HENT:  Negative for congestion, ear discharge and nosebleeds.   Eyes:  Negative for blurred vision.  Respiratory:  Negative for cough, hemoptysis, sputum production, shortness of breath and wheezing.   Cardiovascular:  Negative for chest pain, palpitations, orthopnea and claudication.  Gastrointestinal:  Negative for abdominal pain, blood in stool, constipation, diarrhea, heartburn, melena, nausea and vomiting.  Genitourinary:  Negative for dysuria, flank pain, frequency, hematuria and urgency.  Musculoskeletal:  Negative for back pain, joint  pain and myalgias.  Skin:  Negative for rash.  Neurological:  Negative for dizziness, tingling, focal weakness, seizures, weakness and headaches.  Endo/Heme/Allergies:  Does not bruise/bleed easily.  Psychiatric/Behavioral:  Negative for depression and suicidal ideas. The patient does not have insomnia.       Allergies  Allergen Reactions   Other Other (See Comments)    Walnuts and Pecans;  History of Gastric Bypass pass   Amoxicillin    Penicillins Swelling   Tetanus Toxoid    Tetanus Toxoids      Past Medical History:  Diagnosis Date   Anemia      Past Surgical History:  Procedure Laterality Date   CHOLECYSTECTOMY     GASTRIC BYPASS  02-03-08   REFRACTIVE SURGERY  2006   WISDOM TOOTH EXTRACTION      Social History   Socioeconomic History   Marital status: Married    Spouse name: Not on file   Number of children: Not on file   Years of education: Not on file   Highest education level: Not on file  Occupational History   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  Tobacco Use   Smoking status: Never   Smokeless tobacco: Former  Substance and Sexual Activity   Alcohol use: No   Drug use: No   Sexual activity: Yes    Partners: Male  Other Topics Concern   Not on file  Social History Narrative   Exercise-- walking, water aerobics   Social Determinants of Health   Financial Resource Strain: Medium Risk (05/18/2022)   Received from [facility], [facility]  Overall Financial Resource Strain (CARDIA)    Difficulty of Paying Living Expenses: Somewhat hard  Food Insecurity: Food Insecurity Present (05/18/2022)   Received from [facility], [facility]   Hunger Vital Sign    Worried About Running Out of Food in the Last Year: Sometimes true    Ran Out of Food in the Last Year: Sometimes true  Transportation Needs: No Transportation Needs (05/18/2022)   Received from [facility], [facility]   [facility] - Transportation    In the past 12 months, has lack of transportation kept you from medical appointments or from getting medications?: No    Lack of Transportation (Non-Medical): No  Physical Activity: Not on file  Stress: Not on file  Social Connections: Not on file  Intimate Partner Violence: Not on file    Family History  Problem Relation Age of Onset   Atrial fibrillation Mother    Heart failure Mother    Deep vein thrombosis Mother    Diabetes Mother    Hyperlipidemia Mother    Hypertension Mother    Hyperlipidemia Father    Hypertension Father    Diabetes Sister    Stroke Maternal Grandfather    Heart disease Maternal Grandmother    Breast cancer Cousin        cousins both sides     Current Outpatient Medications:    amphetamine-dextroamphetamine (ADDERALL) 30 MG tablet, Take 30 mg by mouth 2 (two) times daily., Disp: , Rfl:    cyanocobalamin (VITAMIN B12) 1000 MCG/ML injection, INJECT 1 ML (1,000 MCG TOTAL) INTO THE SKIN EVERY 30 (THIRTY) DAYS., Disp: 1 mL, Rfl: 10   cyclobenzaprine (FLEXERIL) 5 MG tablet, Take 1-2 tablets (5-10 mg total) by mouth 3 (three) times daily as needed for muscle spasms., Disp: 20 tablet, Rfl: 0   ipratropium (ATROVENT) 0.02 % nebulizer solution, SMARTSIG:2.5 Milliliter(s) Via Nebulizer 4 Times Daily, Disp: , Rfl:    loratadine (CLARITIN) 10 MG tablet, Take 1 tablet (10 mg total) by mouth daily., Disp: 30 tablet, Rfl: 11   meloxicam (MOBIC) 7.5 MG tablet, Take 7.5 mg by mouth as needed. , Disp: , Rfl:    metFORMIN (GLUCOPHAGE-XR) 500 MG 24 hr tablet, Take by mouth., Disp: , Rfl:    omeprazole (PRILOSEC) 40 MG capsule, Take 1 capsule (40 mg total) by mouth daily., Disp: 90 capsule, Rfl: 3   SYRINGE-NEEDLE, DISP, 3 ML (BD INTEGRA SYRINGE) 25G X 1" 3 ML MISC, Use for B 12 injections every 30 days, Disp: 3 each, Rfl: 3   albuterol (PROVENTIL) (2.5 MG/3ML) 0.083% nebulizer solution, Inhale 2.5 mg into  the lungs every 4 (four) hours as needed.  (Patient not taking: Reported on 11/07/2021), Disp: , Rfl:    clonazePAM (KLONOPIN) 0.5 MG tablet, TAKE 1 TABLET BY MOUTH NIGHTLY AS NEEDED FOR ANXIETY (Patient not taking: Reported on 11/07/2021), Disp: , Rfl: 2  Physical exam:  Vitals:   07/17/22 1357  BP: 123/75  Pulse: 77  Resp: 18  Temp: (!) 97.1 F (36.2 C)  TempSrc: Tympanic  SpO2: 99%  Weight: 239 lb 12.8 oz (108.8 kg)  Height: 5\' 6"  (1.676 m)   Physical Exam Constitutional:      Appearance: She is obese.  Cardiovascular:     Rate and Rhythm: Normal rate and regular rhythm.     Heart sounds: Normal heart sounds.  Pulmonary:     Effort: Pulmonary effort is normal.     Breath sounds:  Normal breath sounds.  Abdominal:     General: Bowel sounds are normal.     Palpations: Abdomen is soft.  Skin:    General: Skin is warm and dry.  Neurological:     Mental Status: She is alert and oriented to person, place, and time.         Latest Ref Rng & Units 07/30/2020    1:12 PM  CMP  Glucose 70 - 99 mg/dL 161   BUN 6 - 20 mg/dL 11   Creatinine 0.96 - 1.00 mg/dL 0.45   Sodium 409 - 811 mmol/L 135   Potassium 3.5 - 5.1 mmol/L 4.3   Chloride 98 - 111 mmol/L 102   CO2 22 - 32 mmol/L 25   Calcium 8.9 - 10.3 mg/dL 8.7   Total Protein 6.5 - 8.1 g/dL 7.2   Total Bilirubin 0.3 - 1.2 mg/dL 0.2   Alkaline Phos 38 - 126 U/L 92   AST 15 - 41 U/L 23   ALT 0 - 44 U/L 22       Latest Ref Rng & Units 07/17/2022    1:36 PM  CBC  WBC 4.0 - 10.5 K/uL 7.0   Hemoglobin 12.0 - 15.0 g/dL 91.4   Hematocrit 78.2 - 46.0 % 46.6   Platelets 150 - 400 K/uL 251     No images are attached to the encounter.  No results found.   Assessment and plan- Patient is a 52 y.o. female who is here for follow-up of following issues  Polycythemia: Likely secondary. She has myeloproliferative workup including JAK2 and EPO levels which were unremarkable and therefore it rules out polycythemia vera.  This is  possibly secondary to sleep apnea given her underlying obesity.  However presently her hematocrit remains less than 55 and she does not require any phlebotomy at this time.  Patient has a history of gastric bypass and has had evidence of iron deficiency anemia but has not required any IV iron recently.  Continue to monitor labs in 6 months in 1 year and I will see her back in 1 year   Visit Diagnosis 1. Polycythemia   2. Other fatigue   3. History of iron deficiency      Dr. Owens Shark, MD, MPH [facility] at [facility] [phone] 07/17/2022 4:19 PM

## 2022-07-25 ENCOUNTER — Other Ambulatory Visit: Payer: Self-pay | Admitting: Oncology

## 2022-07-25 DIAGNOSIS — E538 Deficiency of other specified B group vitamins: Secondary | ICD-10-CM

## 2022-08-04 ENCOUNTER — Telehealth: Payer: Self-pay | Admitting: *Deleted

## 2022-08-04 NOTE — Telephone Encounter (Signed)
Sent the fax with demographics and last notes from MD. It was sent through fax [phone]. The fax transmission went through

## 2023-01-04 ENCOUNTER — Encounter: Payer: Self-pay | Admitting: Nurse Practitioner

## 2023-01-17 ENCOUNTER — Inpatient Hospital Stay: Payer: No Typology Code available for payment source | Attending: Family Medicine

## 2023-07-17 ENCOUNTER — Other Ambulatory Visit: Payer: No Typology Code available for payment source

## 2023-07-17 ENCOUNTER — Ambulatory Visit: Payer: No Typology Code available for payment source | Admitting: Oncology

## 2023-08-01 ENCOUNTER — Other Ambulatory Visit: Payer: Self-pay | Admitting: Oncology

## 2023-08-01 DIAGNOSIS — E538 Deficiency of other specified B group vitamins: Secondary | ICD-10-CM

## 2023-08-02 ENCOUNTER — Encounter: Payer: Self-pay | Admitting: Nurse Practitioner

## 2023-08-16 ENCOUNTER — Other Ambulatory Visit: Payer: Self-pay

## 2023-08-16 ENCOUNTER — Other Ambulatory Visit: Payer: Self-pay | Admitting: Pulmonary Disease

## 2023-08-16 DIAGNOSIS — R911 Solitary pulmonary nodule: Secondary | ICD-10-CM

## 2023-08-16 DIAGNOSIS — R058 Other specified cough: Secondary | ICD-10-CM

## 2023-08-16 DIAGNOSIS — D751 Secondary polycythemia: Secondary | ICD-10-CM

## 2023-08-17 ENCOUNTER — Inpatient Hospital Stay: Attending: Oncology

## 2023-08-17 ENCOUNTER — Inpatient Hospital Stay (HOSPITAL_BASED_OUTPATIENT_CLINIC_OR_DEPARTMENT_OTHER): Admitting: Oncology

## 2023-08-17 ENCOUNTER — Encounter: Payer: Self-pay | Admitting: Oncology

## 2023-08-17 VITALS — BP 126/78 | HR 79 | Temp 97.2°F | Resp 16 | Wt 216.0 lb

## 2023-08-17 DIAGNOSIS — E538 Deficiency of other specified B group vitamins: Secondary | ICD-10-CM | POA: Diagnosis not present

## 2023-08-17 DIAGNOSIS — D509 Iron deficiency anemia, unspecified: Secondary | ICD-10-CM | POA: Insufficient documentation

## 2023-08-17 DIAGNOSIS — Z9884 Bariatric surgery status: Secondary | ICD-10-CM | POA: Diagnosis not present

## 2023-08-17 DIAGNOSIS — Z87891 Personal history of nicotine dependence: Secondary | ICD-10-CM | POA: Diagnosis not present

## 2023-08-17 DIAGNOSIS — D751 Secondary polycythemia: Secondary | ICD-10-CM | POA: Diagnosis present

## 2023-08-17 DIAGNOSIS — Z803 Family history of malignant neoplasm of breast: Secondary | ICD-10-CM | POA: Insufficient documentation

## 2023-08-17 LAB — CBC WITH DIFFERENTIAL (CANCER CENTER ONLY)
Abs Immature Granulocytes: 0.02 K/uL (ref 0.00–0.07)
Basophils Absolute: 0 K/uL (ref 0.0–0.1)
Basophils Relative: 1 %
Eosinophils Absolute: 0.2 K/uL (ref 0.0–0.5)
Eosinophils Relative: 3 %
HCT: 45.2 % (ref 36.0–46.0)
Hemoglobin: 15.2 g/dL — ABNORMAL HIGH (ref 12.0–15.0)
Immature Granulocytes: 0 %
Lymphocytes Relative: 25 %
Lymphs Abs: 1.7 K/uL (ref 0.7–4.0)
MCH: 28.1 pg (ref 26.0–34.0)
MCHC: 33.6 g/dL (ref 30.0–36.0)
MCV: 83.5 fL (ref 80.0–100.0)
Monocytes Absolute: 0.5 K/uL (ref 0.1–1.0)
Monocytes Relative: 7 %
Neutro Abs: 4.4 K/uL (ref 1.7–7.7)
Neutrophils Relative %: 64 %
Platelet Count: 223 K/uL (ref 150–400)
RBC: 5.41 MIL/uL — ABNORMAL HIGH (ref 3.87–5.11)
RDW: 13.1 % (ref 11.5–15.5)
WBC Count: 6.8 K/uL (ref 4.0–10.5)
nRBC: 0 % (ref 0.0–0.2)

## 2023-08-17 LAB — FERRITIN: Ferritin: 33 ng/mL (ref 11–307)

## 2023-08-17 LAB — IRON AND TIBC
Iron: 77 ug/dL (ref 28–170)
Saturation Ratios: 25 % (ref 10.4–31.8)
TIBC: 308 ug/dL (ref 250–450)
UIBC: 231 ug/dL

## 2023-08-17 NOTE — Progress Notes (Signed)
 Hematology/Oncology Consult note [facility]  [phone] Fax:([phone]  Patient Care Team: Zachary Idelia LABOR, MD as PCP - General (Family Medicine) Melanee Annah BROCKS, MD as Consulting Physician (Oncology)   Name of the patient: Erin Howe  [id number]  28-Mar-1970   Date of visit: 08/17/23  Diagnosis- polycythemia likely secondary History of iron  deficiency    Chief complaint/ Reason for visit-routine follow-up of polycythemia  Heme/Onc history:  Patient is a 53 year old female who was previously seen by Dr. Lonn for iron  deficiency anemia.  She has a history of gastric bypass.  She had normal CBC up until 2011. She then began to have progressive microcytic anemia starting 2012. Her hemoglobin was 8 with an MCV of 64 in August 2017. Ferritin was low at 3. She also has B12 deficiency and self-administered B12 injections at home. No routine use of NSAIDs. She has been on oral iron  in the past with no improvement in her anemia. She does have a history of gastric bypass surgery in the past. Last dose of IV iron  was in 2023  Interval history-presently she feels well.  Denies any changes in her appetite or weight.  Denies any blood loss in her stool or urine.  ECOG PS- 1 Pain scale- 0   Review of systems- Review of Systems  Constitutional:  Negative for chills, fever, malaise/fatigue and weight loss.  HENT:  Negative for congestion, ear discharge and nosebleeds.   Eyes:  Negative for blurred vision.  Respiratory:  Negative for cough, hemoptysis, sputum production, shortness of breath and wheezing.   Cardiovascular:  Negative for chest pain, palpitations, orthopnea and claudication.  Gastrointestinal:  Negative for abdominal pain, blood in stool, constipation, diarrhea, heartburn, melena, nausea and vomiting.  Genitourinary:  Negative for dysuria, flank pain, frequency, hematuria and urgency.  Musculoskeletal:  Negative for back pain, joint pain  and myalgias.  Skin:  Negative for rash.  Neurological:  Negative for dizziness, tingling, focal weakness, seizures, weakness and headaches.  Endo/Heme/Allergies:  Does not bruise/bleed easily.  Psychiatric/Behavioral:  Negative for depression and suicidal ideas. The patient does not have insomnia.       Allergies  Allergen Reactions   Other Other (See Comments)    Walnuts and Pecans;  History of Gastric Bypass pass   Amoxicillin    Penicillins Swelling   Tetanus Toxoid    Tetanus Toxoids      Past Medical History:  Diagnosis Date   Anemia      Past Surgical History:  Procedure Laterality Date   CHOLECYSTECTOMY     GASTRIC BYPASS  02-03-08   REFRACTIVE SURGERY  2006   WISDOM TOOTH EXTRACTION      Social History   Socioeconomic History   Marital status: Married    Spouse name: Not on file   Number of children: Not on file   Years of education: Not on file   Highest education level: Not on file  Occupational History   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  Tobacco Use   Smoking status: Never   Smokeless tobacco: Former  Substance and Sexual Activity   Alcohol use: No   Drug use: No   Sexual activity: Yes    Partners: Male  Other Topics Concern   Not on file  Social History Narrative   Exercise-- walking, water aerobics   Social Drivers of Health   Financial Resource Strain: Medium Risk (03/29/2023)   Received from [facility]   Overall  Financial Resource Strain (CARDIA)    Difficulty of Paying Living Expenses: Somewhat hard  Food Insecurity: Food Insecurity Present (03/29/2023)   Received from [facility]   Hunger Vital Sign    Within the past 12 months, you worried that your food would run out before you got the money to buy more.: Sometimes true    Within the past 12 months, the food you bought just didn't last and you didn't have money to get more.: Never true  Transportation Needs: No Transportation Needs  (03/29/2023)   Received from [facility] - Transportation    In the past 12 months, has lack of transportation kept you from medical appointments or from getting medications?: No    Lack of Transportation (Non-Medical): No  Physical Activity: Sufficiently Active (02/14/2023)   Received from [facility]   Exercise Vital Sign    On average, how many days per week do you engage in moderate to strenuous exercise (like a brisk walk)?: 7 days    On average, how many minutes do you engage in exercise at this level?: 30 min  Stress: Stress Concern Present (02/14/2023)   Received from [facility] - Occupational Stress Questionnaire    Feeling of Stress : Rather much  Social Connections: Moderately Isolated (02/14/2023)   Received from [facility]   Social Connection and Isolation Panel    In a typical week, how many times do you talk on the phone with family, friends, or neighbors?: More than three times a week    How often do you get together with friends or relatives?: Once a week    How often do you attend church or religious services?: Never    Do you belong to any clubs or organizations such as church groups, unions, fraternal or athletic groups, or school groups?: No    How often do you attend meetings of the clubs or organizations you belong to?: Never    Are you married, widowed, divorced, separated, never married, or living with a partner?: Married  Catering manager Violence: Not on file    Family History  Problem Relation Age of Onset   Atrial fibrillation Mother    Heart failure Mother    Deep vein thrombosis Mother    Diabetes Mother    Hyperlipidemia Mother    Hypertension Mother    Hyperlipidemia Father    Hypertension Father    Diabetes Sister    Stroke Maternal Grandfather    Heart disease Maternal Grandmother    Breast cancer Cousin        cousins  both sides     Current Outpatient Medications:    amphetamine -dextroamphetamine  (ADDERALL) 30 MG tablet, Take 30 mg by mouth 2 (two) times daily., Disp: , Rfl:    budesonide (PULMICORT) 0.5 MG/2ML nebulizer solution, Inhale 0.5 mg into the lungs., Disp: , Rfl:    cyanocobalamin  (VITAMIN B12) 1000 MCG/ML injection, INJECT 1 ML (1,000 MCG TOTAL) INTO THE SKIN EVERY 30 (THIRTY) DAYS., Disp: 3 mL, Rfl: 3   cyclobenzaprine  (FLEXERIL ) 5 MG tablet, Take 1-2 tablets (5-10 mg total) by mouth 3 (three) times daily as needed for muscle spasms., Disp: 20 tablet, Rfl: 0   ipratropium (ATROVENT) 0.02 % nebulizer solution, SMARTSIG:2.5 Milliliter(s) Via Nebulizer 4 Times Daily, Disp: , Rfl:    loratadine  (CLARITIN ) 10 MG tablet, Take 1 tablet (10 mg total) by mouth daily., Disp:  30 tablet, Rfl: 11   meloxicam (MOBIC) 7.5 MG tablet, Take 7.5 mg by mouth as needed. , Disp: , Rfl:    MOUNJARO 10 MG/0.5ML Pen, SMARTSIG:0.5 Milliliter(s) SUB-Q Once a Week, Disp: , Rfl:    omeprazole  (PRILOSEC) 40 MG capsule, Take 1 capsule (40 mg total) by mouth daily., Disp: 90 capsule, Rfl: 3   PRECISION QID TEST test strip, 1 strip., Disp: , Rfl:    SYRINGE-NEEDLE, DISP, 3 ML (BD INTEGRA SYRINGE) 25G X 1 3 ML MISC, Use for B 12 injections every 30 days, Disp: 3 each, Rfl: 3   Vitamin D , Ergocalciferol , (DRISDOL ) 1.25 MG (50000 UNIT) CAPS capsule, Take 50,000 Units by mouth., Disp: , Rfl:    albuterol (PROVENTIL) (2.5 MG/3ML) 0.083% nebulizer solution, Inhale 2.5 mg into the lungs every 4 (four) hours as needed.  (Patient not taking: Reported on 08/17/2023), Disp: , Rfl:    clonazePAM (KLONOPIN) 0.5 MG tablet, TAKE 1 TABLET BY MOUTH NIGHTLY AS NEEDED FOR ANXIETY (Patient not taking: Reported on 08/17/2023), Disp: , Rfl: 2   metFORMIN (GLUCOPHAGE-XR) 500 MG 24 hr tablet, Take by mouth. (Patient not taking: Reported on 08/17/2023), Disp: , Rfl:   Physical exam:  Vitals:   08/17/23 1334  BP: 126/78  Pulse: 79  Resp: 16  Temp:  (!) 97.2 F (36.2 C)  TempSrc: Tympanic  SpO2: 96%  Weight: 216 lb (98 kg)   Physical Exam Cardiovascular:     Rate and Rhythm: Normal rate and regular rhythm.     Heart sounds: Normal heart sounds.  Pulmonary:     Effort: Pulmonary effort is normal.     Breath sounds: Normal breath sounds.  Skin:    General: Skin is warm and dry.  Neurological:     Mental Status: She is alert and oriented to person, place, and time.      I have personally reviewed labs listed below:    Latest Ref Rng & Units 07/30/2020    1:12 PM  CMP  Glucose 70 - 99 mg/dL 816   BUN 6 - 20 mg/dL 11   Creatinine 9.55 - 1.00 mg/dL 9.33   Sodium 864 - 854 mmol/L 135   Potassium 3.5 - 5.1 mmol/L 4.3   Chloride 98 - 111 mmol/L 102   CO2 22 - 32 mmol/L 25   Calcium 8.9 - 10.3 mg/dL 8.7   Total Protein 6.5 - 8.1 g/dL 7.2   Total Bilirubin 0.3 - 1.2 mg/dL 0.2   Alkaline Phos 38 - 126 U/L 92   AST 15 - 41 U/L 23   ALT 0 - 44 U/L 22       Latest Ref Rng & Units 08/17/2023    1:19 PM  CBC  WBC 4.0 - 10.5 K/uL 6.8   Hemoglobin 12.0 - 15.0 g/dL 84.7   Hematocrit 63.9 - 46.0 % 45.2   Platelets 150 - 400 K/uL 223      Assessment and plan- Patient is a 53 y.o. female here for routine follow-up of polycythemia likely secondary  Patient's hemoglobin has been stable around 15 for the last 2 years with no clear rising trend.  She has not required any phlebotomy so far.  Workup for polycythemia vera including JAK2 mutation testing has been negative.  She does not require any phlebotomy at this time.  Iron  saturation is more than 20% and ferritin levels more than 30.  She does not require any IV iron  as well.  In fact if I give  her IV iron  her polycythemia is likely to get worse.  History of B12 deficiency: She is on B12 injections through her primary care doctor.  Labs in 6 months in 1 year and I will see her back in 1 year   Visit Diagnosis 1. B12 deficiency   2. Polycythemia      Dr. Annah Skene, MD,  MPH [facility] [phone] 08/17/2023 2:34 PM

## 2023-09-11 ENCOUNTER — Ambulatory Visit
Admission: RE | Admit: 2023-09-11 | Discharge: 2023-09-11 | Disposition: A | Discharge status: Home / Self Care | Source: Ambulatory Visit | Attending: Pulmonary Disease | Admitting: Pulmonary Disease

## 2023-09-11 DIAGNOSIS — R911 Solitary pulmonary nodule: Secondary | ICD-10-CM | POA: Insufficient documentation

## 2023-09-11 DIAGNOSIS — R058 Other specified cough: Secondary | ICD-10-CM | POA: Insufficient documentation

## 2023-09-11 MED ORDER — IOHEXOL 9 MG/ML PO SOLN
500.0000 mL | ORAL | Status: AC
  Administered 2023-09-11 (×2): 500 mL via ORAL

## 2023-10-01 ENCOUNTER — Encounter: Payer: Self-pay | Admitting: Nurse Practitioner

## 2024-02-18 ENCOUNTER — Other Ambulatory Visit

## 2024-08-19 ENCOUNTER — Other Ambulatory Visit

## 2024-08-19 ENCOUNTER — Ambulatory Visit: Admitting: Oncology
